# Patient Record
Sex: Male | Born: 1985 | Race: Black or African American | Hispanic: No | Smoking: Current every day smoker
Health system: Southern US, Community
[De-identification: ages and names within clinical notes are randomized; demographics above are authoritative.]

## PROBLEM LIST (undated history)

## (undated) DIAGNOSIS — S62309A Unspecified fracture of unspecified metacarpal bone, initial encounter for closed fracture: Secondary | ICD-10-CM

## (undated) HISTORY — PX: HYPOSPADIAS CORRECTION: SHX483

---

## 2009-09-18 ENCOUNTER — Emergency Department (HOSPITAL_COMMUNITY): Admission: EM | Admit: 2009-09-18 | Discharge: 2009-09-18 | Payer: Self-pay | Admitting: Emergency Medicine

## 2010-06-04 ENCOUNTER — Emergency Department (HOSPITAL_COMMUNITY): Admission: EM | Admit: 2010-06-04 | Discharge: 2010-06-04 | Payer: Self-pay | Admitting: Emergency Medicine

## 2011-03-17 LAB — URINALYSIS, ROUTINE W REFLEX MICROSCOPIC
Bilirubin Urine: NEGATIVE
Hgb urine dipstick: NEGATIVE
Ketones, ur: NEGATIVE mg/dL
Nitrite: NEGATIVE
pH: 5.5 (ref 5.0–8.0)

## 2011-10-14 ENCOUNTER — Emergency Department (HOSPITAL_COMMUNITY)
Admission: EM | Admit: 2011-10-14 | Discharge: 2011-10-14 | Disposition: A | Discharge status: Home / Self Care | Payer: Self-pay | Attending: Emergency Medicine | Admitting: Emergency Medicine

## 2011-10-14 DIAGNOSIS — R599 Enlarged lymph nodes, unspecified: Secondary | ICD-10-CM | POA: Insufficient documentation

## 2011-10-14 DIAGNOSIS — R10819 Abdominal tenderness, unspecified site: Secondary | ICD-10-CM | POA: Insufficient documentation

## 2011-10-14 DIAGNOSIS — R109 Unspecified abdominal pain: Secondary | ICD-10-CM | POA: Insufficient documentation

## 2011-10-14 LAB — URINALYSIS, ROUTINE W REFLEX MICROSCOPIC
Bilirubin Urine: NEGATIVE
Glucose, UA: NEGATIVE mg/dL
Hgb urine dipstick: NEGATIVE
Specific Gravity, Urine: 1.027 (ref 1.005–1.030)
Urobilinogen, UA: 0.2 mg/dL (ref 0.0–1.0)
pH: 6.5 (ref 5.0–8.0)

## 2012-05-10 ENCOUNTER — Emergency Department (HOSPITAL_COMMUNITY)
Admission: EM | Admit: 2012-05-10 | Discharge: 2012-05-10 | Disposition: A | Discharge status: Home / Self Care | Payer: Self-pay | Attending: Emergency Medicine | Admitting: Emergency Medicine

## 2012-05-10 ENCOUNTER — Encounter (HOSPITAL_COMMUNITY): Payer: Self-pay | Admitting: Emergency Medicine

## 2012-05-10 ENCOUNTER — Emergency Department (HOSPITAL_COMMUNITY): Payer: Self-pay

## 2012-05-10 DIAGNOSIS — M546 Pain in thoracic spine: Secondary | ICD-10-CM | POA: Insufficient documentation

## 2012-05-10 DIAGNOSIS — R059 Cough, unspecified: Secondary | ICD-10-CM | POA: Insufficient documentation

## 2012-05-10 DIAGNOSIS — J9801 Acute bronchospasm: Secondary | ICD-10-CM | POA: Insufficient documentation

## 2012-05-10 DIAGNOSIS — R05 Cough: Secondary | ICD-10-CM | POA: Insufficient documentation

## 2012-05-10 DIAGNOSIS — R062 Wheezing: Secondary | ICD-10-CM | POA: Insufficient documentation

## 2012-05-10 DIAGNOSIS — R0602 Shortness of breath: Secondary | ICD-10-CM | POA: Insufficient documentation

## 2012-05-10 DIAGNOSIS — R079 Chest pain, unspecified: Secondary | ICD-10-CM | POA: Insufficient documentation

## 2012-05-10 LAB — DIFFERENTIAL
Eosinophils Absolute: 0.2 10*3/uL (ref 0.0–0.7)
Eosinophils Relative: 1 % (ref 0–5)
Lymphocytes Relative: 9 % — ABNORMAL LOW (ref 12–46)
Lymphs Abs: 1.5 10*3/uL (ref 0.7–4.0)
Monocytes Absolute: 1.2 10*3/uL — ABNORMAL HIGH (ref 0.1–1.0)
Monocytes Relative: 7 % (ref 3–12)

## 2012-05-10 LAB — BASIC METABOLIC PANEL
BUN: 18 mg/dL (ref 6–23)
CO2: 25 mEq/L (ref 19–32)
Calcium: 8.9 mg/dL (ref 8.4–10.5)
GFR calc non Af Amer: >90 mL/min (ref >90)
Glucose, Bld: 117 mg/dL — ABNORMAL HIGH (ref 70–99)

## 2012-05-10 LAB — CBC
HCT: 41.6 % (ref 39.0–52.0)
Hemoglobin: 14.1 g/dL (ref 13.0–17.0)
MCH: 32 pg (ref 26.0–34.0)
MCV: 94.3 fL (ref 78.0–100.0)
RBC: 4.41 MIL/uL (ref 4.22–5.81)

## 2012-05-10 MED ORDER — ALBUTEROL SULFATE HFA 108 (90 BASE) MCG/ACT IN AERS
2.0000 | INHALATION_SPRAY | RESPIRATORY_TRACT | Status: DC
  Administered 2012-05-10: 2 via RESPIRATORY_TRACT
  Filled 2012-05-10: qty 6.7

## 2012-05-10 MED ORDER — PREDNISONE 10 MG PO TABS: 60.0000 mg | ORAL_TABLET | Freq: Every day | ORAL | Status: DC

## 2012-05-10 MED ORDER — ALBUTEROL SULFATE (5 MG/ML) 0.5% IN NEBU
5.0000 mg | INHALATION_SOLUTION | Freq: Once | RESPIRATORY_TRACT | Status: AC
  Administered 2012-05-10: 5 mg via RESPIRATORY_TRACT
  Filled 2012-05-10: qty 1

## 2012-05-10 MED ORDER — IPRATROPIUM BROMIDE 0.02 % IN SOLN
0.5000 mg | Freq: Once | RESPIRATORY_TRACT | Status: AC
  Administered 2012-05-10: 0.5 mg via RESPIRATORY_TRACT
  Filled 2012-05-10: qty 2.5

## 2012-05-10 MED ORDER — PREDNISONE 20 MG PO TABS
60.0000 mg | ORAL_TABLET | Freq: Once | ORAL | Status: AC
  Administered 2012-05-10: 60 mg via ORAL
  Filled 2012-05-10: qty 3

## 2012-05-10 NOTE — ED Notes (Addendum)
Patient complaining of mid-sternal chest pain, productive cough, chest congestion for the past two to three days.  Reports shortness of breath; denies other associated symptoms including nausea, vomiting, and diaphoresis.  Upon arrival to room, patient changed into gown and connected to continuous cardiac, pulse ox, and blood pressure monitor.  Will continue to monitor.

## 2012-05-10 NOTE — ED Notes (Signed)
Called respiratory about administering nebulizer treatments; respiratory tech on way.

## 2012-05-10 NOTE — ED Provider Notes (Signed)
History     CSN: [id number]  Arrival date & time 05/10/12  0143   First MD Initiated Contact with Patient 05/10/12 0157      Chief Complaint  Patient presents with  . Chest Pain     The history is provided by the patient.   patient reports several days of midsternal chest tightness with shortness of breath.  His had mild productive cough and upper back pain.  Has no prior history of asthma but there is a family history of asthma.  He continues to smoke cigarettes.  His had no fevers or chills.  He reports trying his mother's albuterol nebulizer machine at home with some improvement in the symptoms.  He has no prior history of DVT or pulmonary embolism.  There is no family history of early cardiac disease.  Patient reports ongoing cough.  The symptoms are mild to moderate.    History reviewed. No pertinent past medical history.  History reviewed. No pertinent past surgical history.  No family history on file.  History  Substance Use Topics  . Smoking status: Never Smoker   . Smokeless tobacco: Not on file  . Alcohol Use: No      Review of Systems  Cardiovascular: Positive for chest pain.  All other systems reviewed and are negative.    Allergies  Review of patient's allergies indicates no known allergies.  Home Medications   Current Outpatient Rx  Name Route Sig Dispense Refill  . ALBUTEROL SULFATE HFA 108 (90 BASE) MCG/ACT IN AERS Inhalation Inhale 2 puffs into the lungs every 6 (six) hours as needed. For shortness of breath.    . MUCUS RELIEF PO Oral Take 1 tablet by mouth 2 (two) times daily as needed. For chest congestion.    Marland Kitchen PREDNISONE 10 MG PO TABS Oral Take 6 tablets (60 mg total) by mouth daily. 18 tablet 0    BP 134/67  Pulse 101  Temp(Src) 100.7 F (38.2 C) (Oral)  Resp 20  SpO2 96%  Physical Exam  Nursing note and vitals reviewed. Constitutional: He is oriented to person, place, and time. He appears well-developed and well-nourished.  HENT:    Head: Normocephalic and atraumatic.  Eyes: EOM are normal.  Neck: Normal range of motion.  Cardiovascular: Normal rate, regular rhythm, normal heart sounds and intact distal pulses.   Pulmonary/Chest: Effort normal. No respiratory distress. He has wheezes.  Abdominal: Soft. He exhibits no distension. There is no tenderness.  Musculoskeletal: Normal range of motion.  Neurological: He is alert and oriented to person, place, and time.  Skin: Skin is warm and dry.  Psychiatric: He has a normal mood and affect. Judgment normal.    ED Course  Procedures (including critical care time)   Date: 05/10/2012  Rate: 114  Rhythm: Sinus tachycardia  QRS Axis: normal  Intervals: normal  ST/T Wave abnormalities: normal  Conduction Disutrbances: none  Narrative Interpretation:   Old EKG Reviewed: No prior EKG available      Labs Reviewed  CBC - Abnormal; Notable for the following:    WBC 17.7 (*)    All other components within normal limits  DIFFERENTIAL - Abnormal; Notable for the following:    Neutrophils Relative 83 (*)    Neutro Abs 14.7 (*)    Lymphocytes Relative 9 (*)    Monocytes Absolute 1.2 (*)    All other components within normal limits  BASIC METABOLIC PANEL - Abnormal; Notable for the following:    Glucose, Bld 117 (*)  All other components within normal limits  POCT I-STAT TROPONIN I   Dg Chest 2 View  05/10/2012  *RADIOLOGY REPORT*  Clinical Data: Cough, shortness of breath, chest pain and fever for 304 days.  CHEST - 2 VIEW  Comparison: None.  Findings: The heart size and pulmonary vascularity are normal. The lungs appear clear and expanded without focal air space disease or consolidation. No blunting of the costophrenic angles.  No pneumothorax.  IMPRESSION: No evidence of active pulmonary disease.  Original Report Authenticated By: Marlon Pel, M.D.     1. Bronchospasm       MDM  The patient feels much better after albuterol inhaler in the emergency  department.  I suspect this is reactive airway disease/bronchospasm.  The patient be discharged home on a short course of prednisone and albuterol inhaler.  His chest x-ray demonstrates no evidence of pneumonia.  His EKG demonstrated sinus tachycardia without ST changes.  I don't believe this is a pulmonary embolism.  Discharge home in good condition.  The patient understands to return the emergency apartment for new or worsening symptoms        Lyanne Co, MD 05/10/12 [phone]

## 2012-05-10 NOTE — ED Notes (Signed)
Dr. Campos at bedside   

## 2012-05-10 NOTE — ED Notes (Signed)
Patient states that he feels better after nebulizer treatment; will continue to monitor.

## 2012-05-10 NOTE — ED Notes (Signed)
Patient currently sitting up in bed; no respiratory or acute distress noted.  Patient updated on plan of care; informed patient that respiratory tech is on way to administer nebulizer treatment.  Patient has no other questions or concerns at this time; will continue to monitor.

## 2012-05-10 NOTE — ED Notes (Signed)
PT. REPORTS MID CHEST PAIN WITH SOB , PRODUCTIVE COUGH AND UPPER BACK PAIN FOR SEVERAL DAYS .

## 2012-05-10 NOTE — ED Notes (Signed)
Patient given discharge paperwork; went over discharge instructions with patient.  Patient instructed to take Prednisone as directed, to follow up with primary care physician in 3-5 days if symptoms do not improve, and to return to the ED for new, worsening, or concerning symptoms.

## 2012-05-10 NOTE — Discharge Instructions (Signed)
Bronchospasm, Adult  Bronchospasm means that there is a spasm or tightening of the airways going into the lungs. Because the airways go into a spasm and get smaller it makes breathing more difficult.  For reasons not completely known, workings (functions) of the airways designed to protect the lungs become over active. This causes the airways to become more sensitive to:  · Infection.  · Weather.  · Exercise.  · Irritants.  · Things that cause allergic reactions or allergies (allergens).  Frequent coughing or respiratory episodes should be checked for the cause. This condition may be made worse by exercise.  CAUSES   Inflammation is often the cause of this condition. Allergy, viral respiratory infections, or irritants in the air often cause this problem. Allergic reactions produce immediate and delayed responses. Late reactions may produce more serious inflammation. This may lead to increased reactivity of the airways. Sometimes this is inherited.  Some common triggers are:  · Allergies.  · Infection commonly triggers attacks. Antibiotics are not helpful for viral infections and usually do not help with attacks of bronchospasm.  · Exercise (running, etc.) can trigger an attack. Proper pre-exercise medications help most individuals participate in sports. Swimming is the least likely sport to cause problems.  · Irritants (for example, pollution, cigarette smoke, strong odors, aerosol sprays, paint fumes, etc.) may trigger attacks. You cannot smoke and do not allow smoking in your home. This is absolutely necessary. Show this instruction to mates, relatives and significant others that may not agree with you.  · Weather changes may cause lung problems but moving around trying to find an ideal climate does not seem to be overly helpful. Winds increase molds and pollens in the air. Rain refreshes the air by washing irritants out. Cold air may cause irritation.  · Emotional problems do not cause lung problems but can  trigger attacks.  SYMPTOMS   Wheezing is the most common symptom. Frequent coughing (with or without exercise and or crying) and repeated respiratory infections are all early warning signs of bronchospasm. Chest tightness and shortness of breath are other symptoms.  DIAGNOSIS   Early hidden bronchospasm may go for long periods of time without being detected. This is especially true if wheezing cannot be detected by your caregiver. Lung (pulmonary) function studies may help with diagnosis in these cases.  HOME CARE INSTRUCTIONS   · It is necessary to remain calm during an attack. Try to relax and breathe more slowly. During this time medications may be given. If any breathing problems seem to be getting worse and are unresponsive to treatment seek immediate medical care.  · If you have severe breathing difficulty or have had a life threatening attack it is probably a good idea for you to learn how to give adrenaline (epi-pen) or use an anaphylaxis kit. Your caregiver can help you with this. These are the same kits carried by people who have severe allergic reactions. This is especially important if you do not have readily accessible medical care.  · With any severe breathing problems where epinephrine (adrenaline) has been given at home call 911 immediately as the delayed reaction may be even more severe.  SEEK MEDICAL CARE IF:   · There is wheezing and shortness of breath, even if medications are given to prevent attacks.  · An oral temperature above 102° F (38.9° C) develops.  · There are muscle aches, chest pain, or thickening of sputum.  · The sputum changes from clear or white to yellow, green,   gray, or bloody.  · There are problems that may be related to the medicine you are given, such as a rash, itching, swelling, or trouble breathing.  SEEK IMMEDIATE MEDICAL CARE IF:   · The usual medicines do not stop your wheezing, or there is increased coughing.  · You have increased difficulty breathing.  MAKE SURE YOU:    · Understand these instructions.  · Will watch your condition.  · Will get help right away if you are not doing well or get worse.  Document Released: 12/18/2003 Document Revised: 12/04/2011 Document Reviewed: 08/02/2008  [employer]® Patient Information ©2012 [employer].

## 2012-05-10 NOTE — ED Notes (Signed)
EKG completed at 0152. Given to Dr. Patria Mane. No OLD ekg.

## 2013-02-12 ENCOUNTER — Encounter (HOSPITAL_COMMUNITY): Payer: Self-pay | Admitting: *Deleted

## 2013-02-12 ENCOUNTER — Emergency Department (HOSPITAL_COMMUNITY): Payer: Self-pay

## 2013-02-12 ENCOUNTER — Emergency Department (HOSPITAL_COMMUNITY)
Admission: EM | Admit: 2013-02-12 | Discharge: 2013-02-12 | Disposition: A | Discharge status: Home / Self Care | Payer: Self-pay | Attending: Emergency Medicine | Admitting: Emergency Medicine

## 2013-02-12 DIAGNOSIS — R55 Syncope and collapse: Secondary | ICD-10-CM | POA: Insufficient documentation

## 2013-02-12 DIAGNOSIS — T07XXXA Unspecified multiple injuries, initial encounter: Secondary | ICD-10-CM

## 2013-02-12 DIAGNOSIS — F17200 Nicotine dependence, unspecified, uncomplicated: Secondary | ICD-10-CM | POA: Insufficient documentation

## 2013-02-12 DIAGNOSIS — S060XAA Concussion with loss of consciousness status unknown, initial encounter: Secondary | ICD-10-CM

## 2013-02-12 DIAGNOSIS — Y9389 Activity, other specified: Secondary | ICD-10-CM | POA: Insufficient documentation

## 2013-02-12 DIAGNOSIS — Y9289 Other specified places as the place of occurrence of the external cause: Secondary | ICD-10-CM | POA: Insufficient documentation

## 2013-02-12 DIAGNOSIS — R296 Repeated falls: Secondary | ICD-10-CM | POA: Insufficient documentation

## 2013-02-12 DIAGNOSIS — S060X9A Concussion with loss of consciousness of unspecified duration, initial encounter: Secondary | ICD-10-CM | POA: Insufficient documentation

## 2013-02-12 DIAGNOSIS — IMO0002 Reserved for concepts with insufficient information to code with codable children: Secondary | ICD-10-CM | POA: Insufficient documentation

## 2013-02-12 MED ORDER — IBUPROFEN 200 MG PO TABS
600.0000 mg | ORAL_TABLET | Freq: Once | ORAL | Status: AC
  Administered 2013-02-12: 600 mg via ORAL
  Filled 2013-02-12: qty 3
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
## 2013-02-12 NOTE — ED Notes (Signed)
Patient was able to walk to Pod A7 without a problem.
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
## 2013-02-12 NOTE — ED Notes (Signed)
Pt parents driving pt home.

## 2013-02-12 NOTE — Discharge Instructions (Signed)
Concussion and Brain Injury  A blow or jolt to the head can disrupt the normal function of the brain. This type of brain injury is often called a "concussion" or a "closed head injury." Concussions are usually not life-threatening. Even so, the effects of a concussion can be serious.   CAUSES   A concussion is caused by a blunt blow to the head. The blow might be direct or indirect as described below.  · Direct blow (running into another player during a soccer game, being hit in a fight, or hitting your head on a hard surface).  · Indirect blow (when your head moves rapidly and violently back and forth like in a car crash).  SYMPTOMS   The brain is very complex. Every head injury is different. Some symptoms may appear right away. Other symptoms may not show up for days or weeks after the concussion. The signs of concussion can be hard to notice. Early on, problems may be missed by patients, family members, and caregivers. You may look fine even though you are acting or feeling differently.   These symptoms are usually temporary, but may last for days, weeks, or even longer. Symptoms include:  · Mild headaches that will not go away.  · Having more trouble than usual with:  · Remembering things.  · Paying attention or concentrating.  · Organizing daily tasks.  · Making decisions and solving problems.  · Slowness in thinking, acting, speaking, or reading.  · Getting lost or easily confused.  · Feeling tired all the time or lacking energy (fatigue).  · Feeling drowsy.  · Sleep disturbances.  · Sleeping more than usual.  · Sleeping less than usual.  · Trouble falling asleep.  · Trouble sleeping (insomnia).  · Loss of balance or feeling lightheaded or dizzy.  · Nausea or vomiting.  · Numbness or tingling.  · Increased sensitivity to:  · Sounds.  · Lights.  · Distractions.  Other symptoms might include:  · Vision problems or eyes that tire easily.  · Diminished sense of taste or smell.  · Ringing in the ears.  · Mood  changes such as feeling sad, anxious, or listless.  · Becoming easily irritated or angry for little or no reason.  · Lack of motivation.  DIAGNOSIS   Your caregiver can usually diagnose a concussion or mild brain injury based on your description of your injury and your symptoms.   Your evaluation might include:  · A brain scan to look for signs of injury to the brain. Even if the test shows no injury, you may still have a concussion.  · Blood tests to be sure other problems are not present.  TREATMENT   · People with a concussion need to be examined and evaluated. Most people with concussions are treated in an emergency department, urgent care, or clinic. Some people must stay in the hospital overnight for further treatment.  · Your caregiver will send you home with important instructions to follow. Be sure to carefully follow them.  · Tell your caregiver if you are already taking any medicines (prescription, over-the-counter, or natural remedies), or if you are drinking alcohol or taking illegal drugs. Also, talk with your caregiver if you are taking blood thinners (anticoagulants) or aspirin. These drugs may increase your chances of complications. All of this is important information that may affect treatment.  · Only take over-the-counter or prescription medicines for pain, discomfort, or fever as directed by your caregiver.  PROGNOSIS     How fast people recover from brain injury varies from person to person. Although most people have a good recovery, how quickly they improve depends on many factors. These factors include how severe their concussion was, what part of the brain was injured, their age, and how healthy they were before the concussion.   Because all head injuries are different, so is recovery. Most people with mild injuries recover fully. Recovery can take time. In general, recovery is slower in older persons. Also, persons who have had a concussion in the past or have other medical problems may find  that it takes longer to recover from their current injury. Anxiety and depression may also make it harder to adjust to the symptoms of brain injury.  HOME CARE INSTRUCTIONS   Return to your normal activities slowly, not all at once. You must give your body and brain enough time for recovery.  · Get plenty of sleep at night, and rest during the day. Rest helps the brain to heal.  · Avoid staying up late at night.  · Keep the same bedtime hours on weekends and weekdays.  · Take daytime naps or rest breaks when you feel tired.  · Limit activities that require a lot of thought or concentration (brain or cognitive rest). This includes:  · Homework or job-related work.  · Watching TV.  · Computer work.  · Avoid activities that could lead to a second brain injury, such as contact or recreational sports, until your caregiver says it is okay. Even after your brain injury has healed, you should protect yourself from having another concussion.  · Ask your caregiver when you can return to your normal activities such as driving, bicycling, or operating heavy equipment. Your ability to react may be slower after a brain injury.  · Talk with your caregiver about when you can return to work or school.  · Inform your teachers, school nurse, school counselor, coach, athletic trainer, or work manager about your injury, symptoms, and restrictions. They should be instructed to report:  · Increased problems with attention or concentration.  · Increased problems remembering or learning new information.  · Increased time needed to complete tasks or assignments.  · Increased irritability or decreased ability to cope with stress.  · Increased symptoms.  · Take only those medicines that your caregiver has approved.  · Do not drink alcohol until your caregiver says you are well enough to do so. Alcohol and certain other drugs may slow your recovery and can put you at risk of further injury.  · If it is harder than usual to remember things,  write them down.  · If you are easily distracted, try to do one thing at a time. For example, do not try to watch TV while fixing dinner.  · Talk with family members or close friends when making important decisions.  · Keep all follow-up appointments. Repeated evaluation of your symptoms is recommended for your recovery.  PREVENTION   Protect your head from future injury. It is very important to avoid another head or brain injury before you have recovered. In rare cases, another injury has lead to permanent brain damage, brain swelling, or death. Avoid injuries by using:  · Seatbelts when riding in a car.  · Alcohol only in moderation.  · A helmet when biking, skiing, skateboarding, skating, or doing similar activities.  · Safety measures in your home.  · Remove clutter and tripping hazards from floors and stairways.  · Use grab   bars in bathrooms and handrails by stairs.  · Place non-slip mats on floors and in bathtubs.  · Improve lighting in dim areas.  SEEK MEDICAL CARE IF:   A head injury can cause lingering symptoms. You should seek medical care if you have any of the following symptoms for more than 3 weeks after your injury or are planning to return to sports:  · Chronic headaches.  · Dizziness or balance problems.  · Nausea.  · Vision problems.  · Increased sensitivity to noise or light.  · Depression or mood swings.  · Anxiety or irritability.  · Memory problems.  · Difficulty concentrating or paying attention.  · Sleep problems.  · Feeling tired all the time.  SEEK IMMEDIATE MEDICAL CARE IF:   You have had a blow or jolt to the head and you (or your family or friends) notice:  · Severe or worsening headaches.  · Weakness (even if only in one hand or one leg or one part of the face), numbness, or decreased coordination.  · Repeated vomiting.  · Increased sleepiness or passing out.  · One black center of the eye (pupil) is larger than the other.  · Convulsions (seizures).  · Slurred speech.  · Increasing  confusion, restlessness, agitation, or irritability.  · Lack of ability to recognize people or places.  · Neck pain.  · Difficulty being awakened.  · Unusual behavior changes.  · Loss of consciousness.  Older adults with a brain injury may have a higher risk of serious complications such as a blood clot on the brain. Headaches that get worse or an increase in confusion are signs of this complication. If these signs occur, see a caregiver right away.  MAKE SURE YOU:   · Understand these instructions.  · Will watch your condition.  · Will get help right away if you are not doing well or get worse.  FOR MORE INFORMATION   Several groups help people with brain injury and their families. They provide information and put people in touch with local resources. These include support groups, rehabilitation services, and a variety of health care professionals. Among these groups, the Brain Injury Association (BIA, www.biausa.org) has a national office that gathers scientific and educational information and works on a national level to help people with brain injury.   Document Released: 03/06/2004 Document Revised: 03/08/2012 Document Reviewed: 08/02/2008  ExitCare® Patient Information ©2013 [employer].

## 2014-03-03 DIAGNOSIS — S62309A Unspecified fracture of unspecified metacarpal bone, initial encounter for closed fracture: Secondary | ICD-10-CM

## 2014-03-03 HISTORY — DX: Unspecified fracture of unspecified metacarpal bone, initial encounter for closed fracture: S62.309A

## 2014-03-06 ENCOUNTER — Emergency Department (HOSPITAL_COMMUNITY)
Admission: EM | Admit: 2014-03-06 | Discharge: 2014-03-06 | Disposition: A | Discharge status: Home / Self Care | Payer: Self-pay | Attending: Emergency Medicine | Admitting: Emergency Medicine

## 2014-03-06 ENCOUNTER — Encounter (HOSPITAL_COMMUNITY): Payer: Self-pay | Admitting: Emergency Medicine

## 2014-03-06 ENCOUNTER — Emergency Department (HOSPITAL_COMMUNITY): Payer: Self-pay

## 2014-03-06 DIAGNOSIS — M79641 Pain in right hand: Secondary | ICD-10-CM

## 2014-03-06 DIAGNOSIS — Y93E5 Activity, floor mopping and cleaning: Secondary | ICD-10-CM | POA: Insufficient documentation

## 2014-03-06 DIAGNOSIS — W208XXA Other cause of strike by thrown, projected or falling object, initial encounter: Secondary | ICD-10-CM | POA: Insufficient documentation

## 2014-03-06 DIAGNOSIS — Y92009 Unspecified place in unspecified non-institutional (private) residence as the place of occurrence of the external cause: Secondary | ICD-10-CM | POA: Insufficient documentation

## 2014-03-06 DIAGNOSIS — S62309A Unspecified fracture of unspecified metacarpal bone, initial encounter for closed fracture: Secondary | ICD-10-CM

## 2014-03-06 DIAGNOSIS — S62329A Displaced fracture of shaft of unspecified metacarpal bone, initial encounter for closed fracture: Secondary | ICD-10-CM | POA: Insufficient documentation

## 2014-03-06 DIAGNOSIS — F17200 Nicotine dependence, unspecified, uncomplicated: Secondary | ICD-10-CM | POA: Insufficient documentation

## 2014-03-06 IMAGING — CR DG HAND COMPLETE 3+V*R*
3 series · 3 of 3 positions shown · non-contrast
Comparison: None.

CLINICAL DATA: Recent traumatic injury with pain

EXAM:
RIGHT HAND - COMPLETE 3+ VIEW

[x hand pa right *]
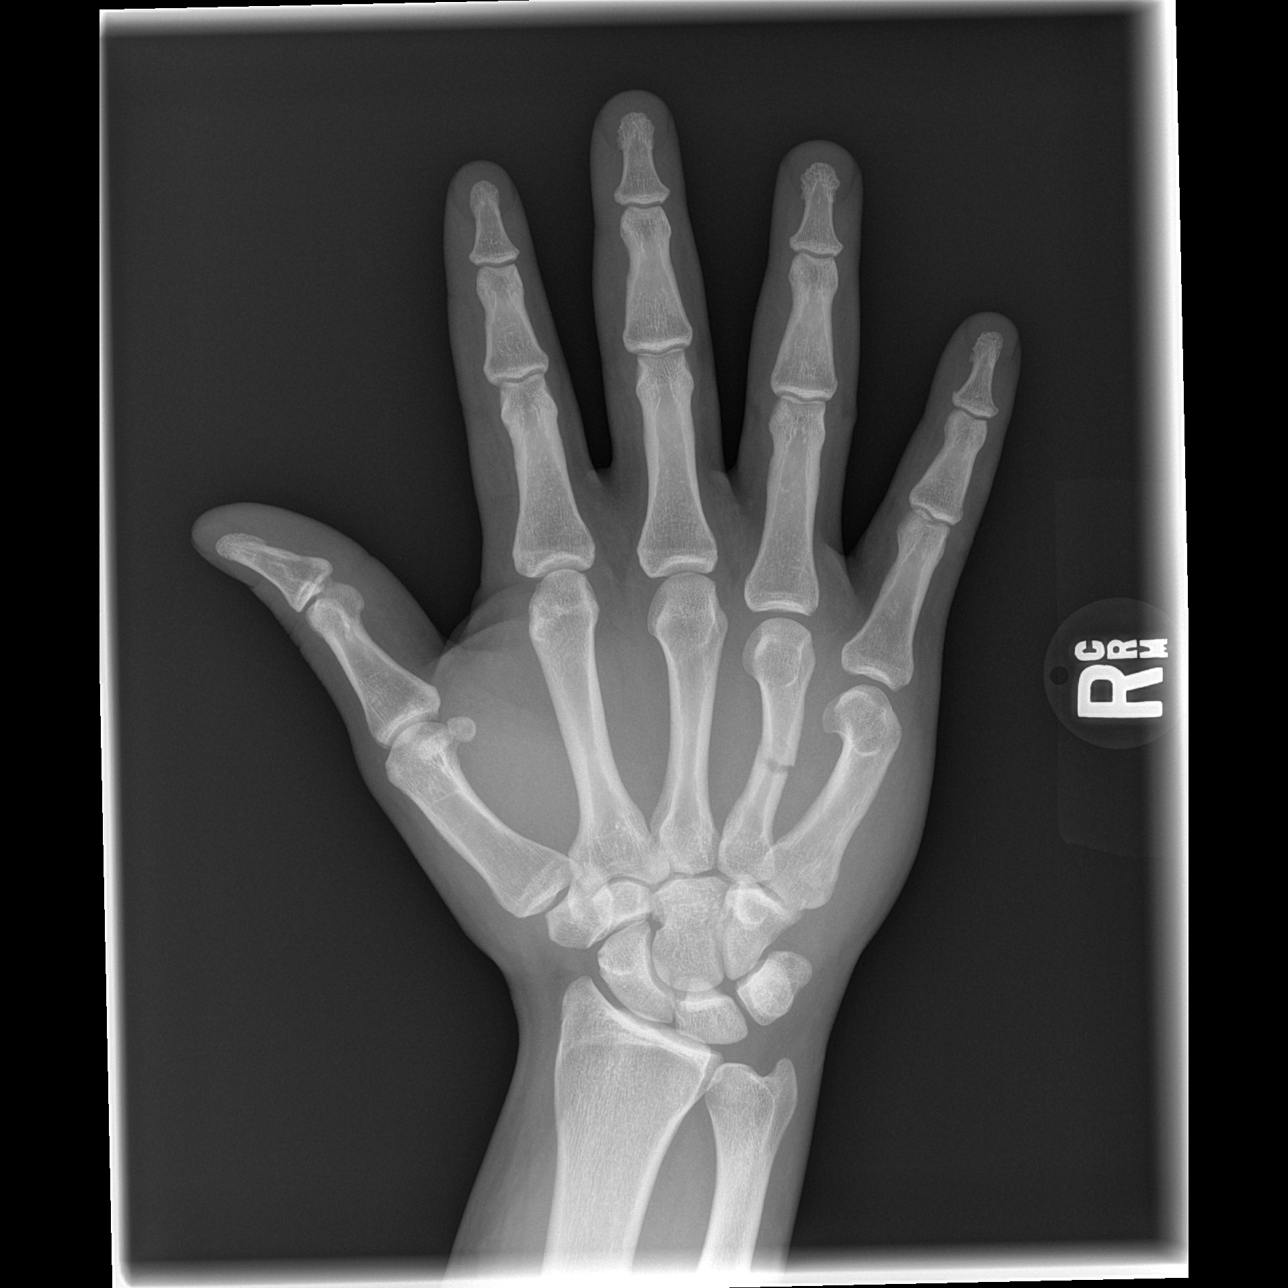

[x hand oblique right *]
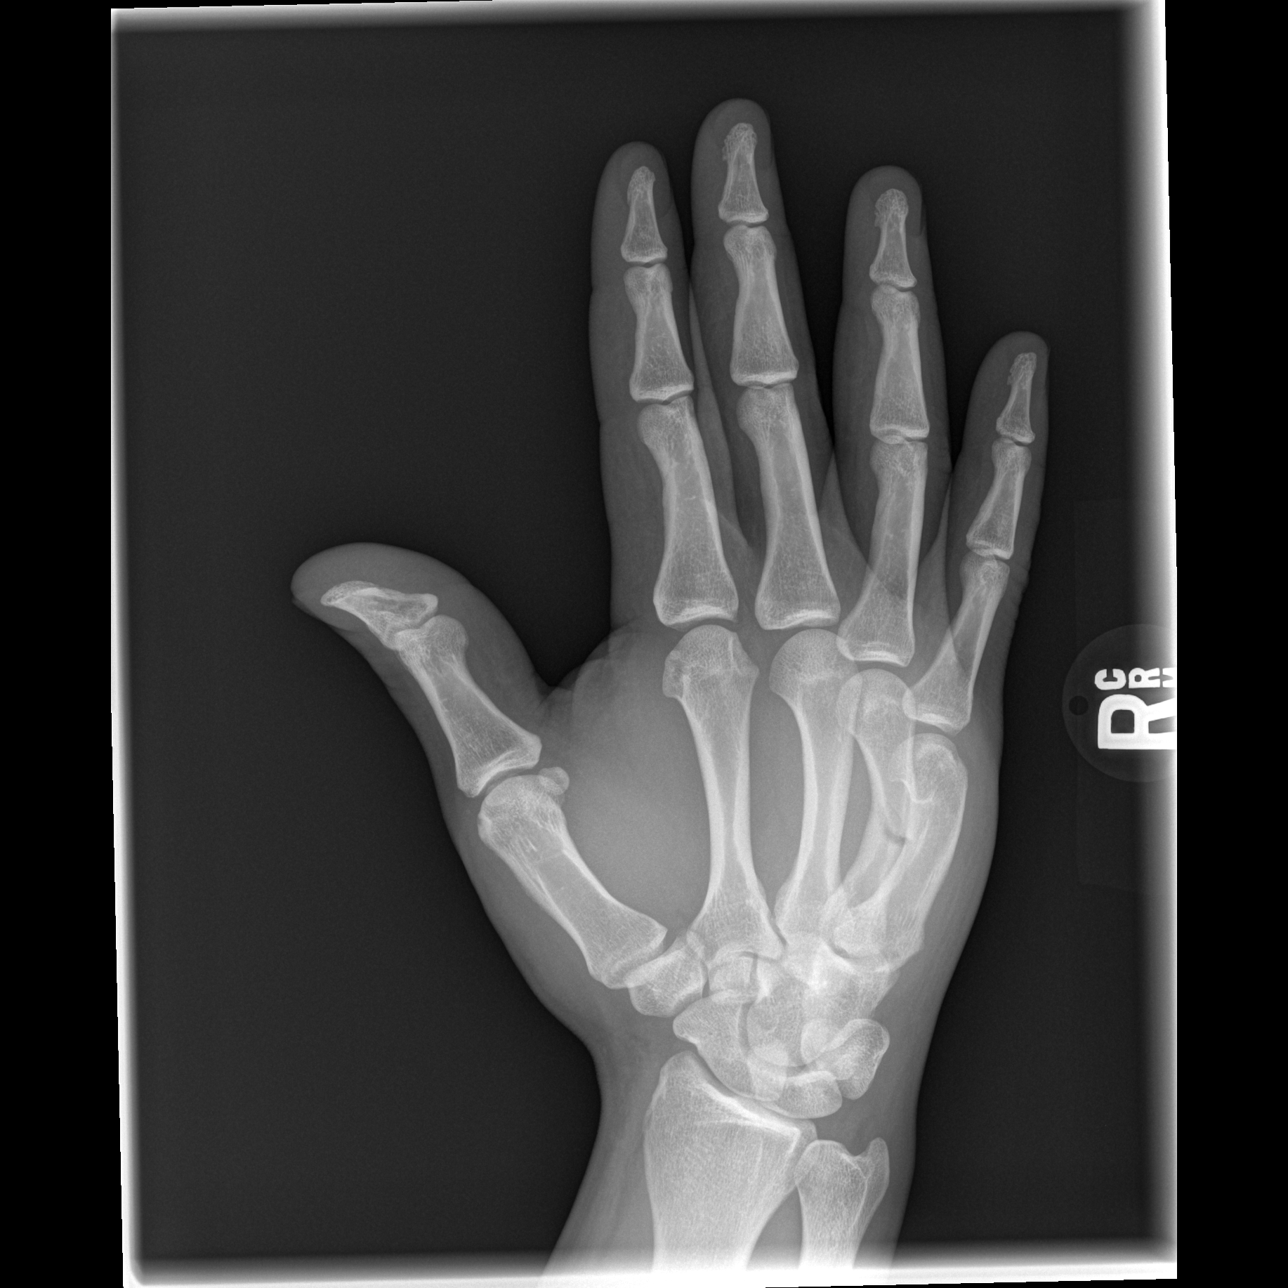

[x hand lat right]
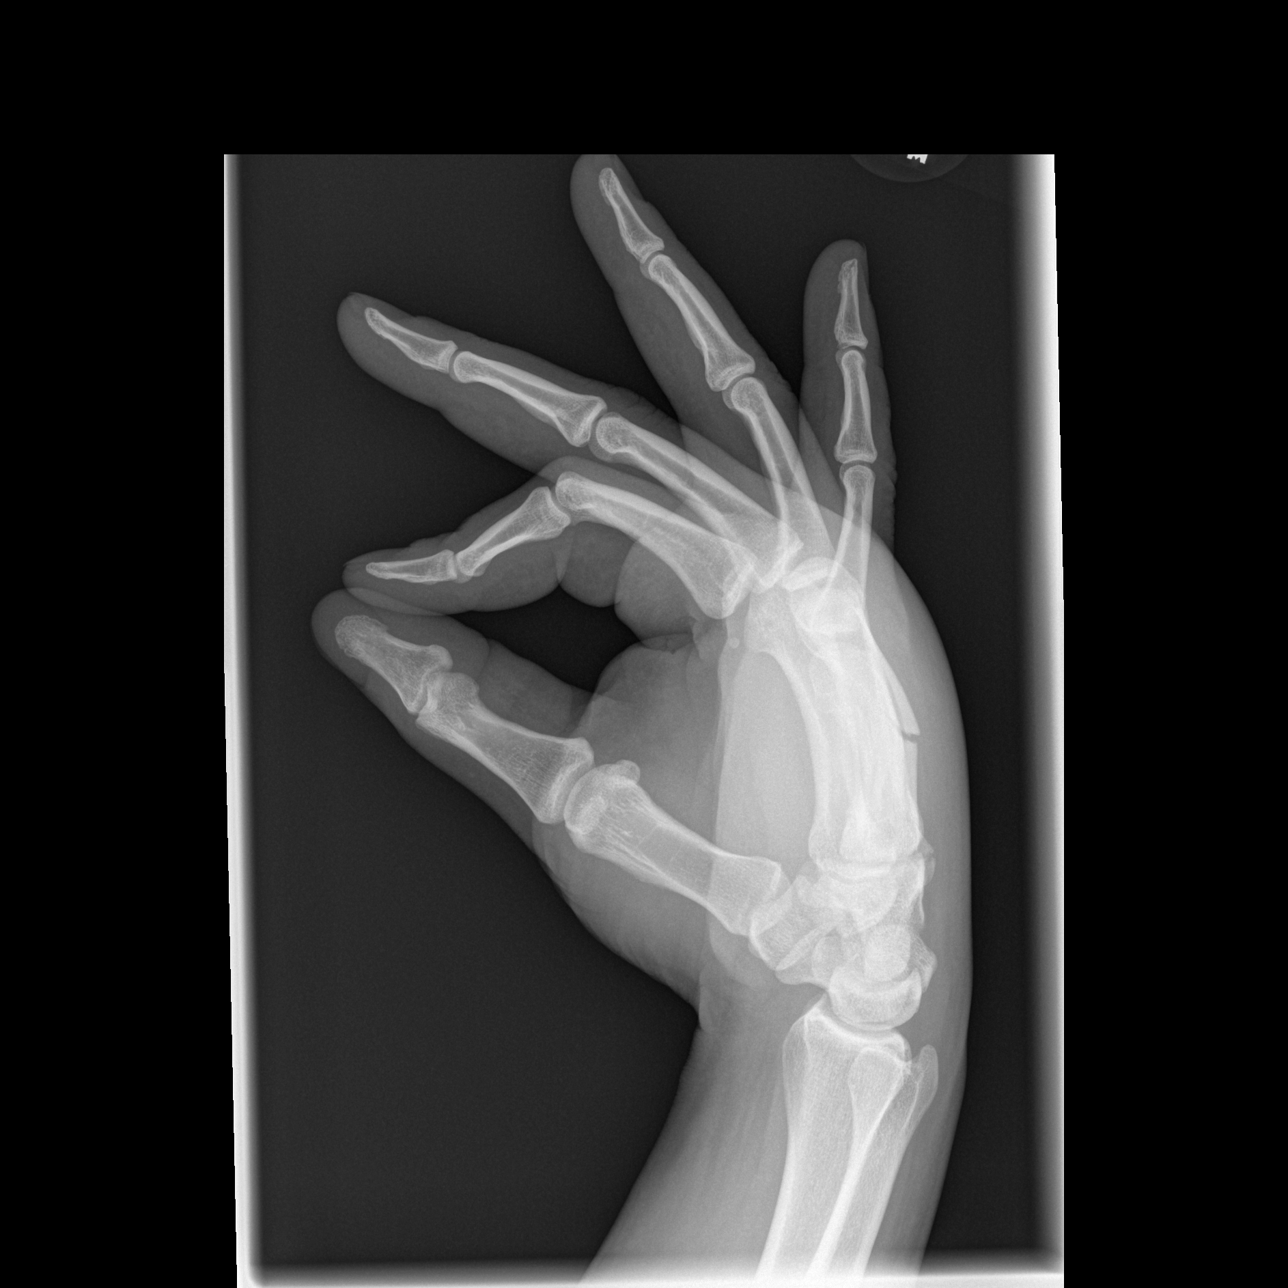

[3 of 3 positions shown; findings below may reference images not displayed]

FINDINGS: There are changes consistent with a prior healed fracture of the
fifth metacarpal distally. A midshaft fourth metacarpal fracture is
noted with mild angulation at the fracture site. No other bony
abnormality is seen.
IMPRESSION: Acute fourth metacarpal fracture

## 2014-03-06 MED ORDER — IBUPROFEN 600 MG PO TABS: 600.0000 mg | ORAL_TABLET | Freq: Four times a day (QID) | ORAL | Status: DC | PRN

## 2014-03-06 NOTE — ED Notes (Signed)
Ortho at bedside.

## 2014-03-06 NOTE — ED Notes (Signed)
Ortho tech states she will be the pt after she finishes up with the pt she is with.

## 2014-03-06 NOTE — Discharge Instructions (Signed)
Followup with Dr. Mina Marble in the office tomorrow. Call office in the morning to schedule appointment. Recommend that you take ibuprofen as needed for pain control. Also ice your hand to limit swelling. Return to the emergency department if symptoms worsen.  Hand Fracture, Metacarpals Fractures of metacarpals are breaks in the bones of the hand. They extend from the knuckles to the wrist. These bones can undergo many types of fractures. There are different ways of treating these fractures, all of which may be correct. TREATMENT  Hand fractures can be treated with:   Non-reduction - The fracture is casted without changing the positions of the fracture (bone pieces) involved. This fracture is usually left in a cast for 4 to 6 weeks or as your caregiver thinks necessary.  Closed reduction - The bones are moved back into position without surgery and then casted.  ORIF (open reduction and internal fixation) - The fracture site is opened and the bone pieces are fixed into place with some type of hardware, such as screws, etc. They are then casted. Your caregiver will discuss the type of fracture you have and the treatment that should be best for that problem. If surgery is chosen, let your caregivers know about the following.  LET YOUR CAREGIVERS KNOW ABOUT:  Allergies.  Medications you are taking, including herbs, eye drops, over the counter medications, and creams.  Use of steroids (by mouth or creams).  Previous problems with anesthetics or novocaine.  Possibility of pregnancy.  History of blood clots (thrombophlebitis).  History of bleeding or blood problems.  Previous surgeries.  Other health problems. AFTER THE PROCEDURE After surgery, you will be taken to the recovery area where a nurse will watch and check your progress. Once you are awake, stable, and taking fluids well, barring other problems, you'll be allowed to go home. Once home, an ice pack applied to your operative site may  help with pain and keep the swelling down. HOME CARE INSTRUCTIONS   Follow your caregiver's instructions as to activities, exercises, physical therapy, and driving a car.  Daily exercise is helpful for keeping range of motion and strength. Exercise as instructed.  To lessen swelling, keep the injured hand elevated above the level of your heart as much as possible.  Apply ice to the injury for 15-20 minutes each hour while awake for the first 2 days. Put the ice in a plastic bag and place a thin towel between the bag of ice and your cast.  Move the fingers of your casted hand several times a day.  If a plaster or fiberglass cast was applied:  Do not try to scratch the skin under the cast using a sharp or pointed object.  Check the skin around the cast every day. You may put lotion on red or sore areas.  Keep your cast dry. Your cast can be protected during bathing with a plastic bag. Do not put your cast into the water.  If a plaster splint was applied:  Wear your splint for as long as directed by your caregiver or until seen again.  Do not get your splint wet. Protect it during bathing with a plastic bag.  You may loosen the elastic bandage around the splint if your fingers start to get numb, tingle, get cold or turn blue.  Do not put pressure on your cast or splint; this may cause it to break. Especially, do not lean plaster casts on hard surfaces for 24 hours after application.  Take medications as  directed by your caregiver.  Only take over-the-counter or prescription medicines for pain, discomfort, or fever as directed by your caregiver.  Follow-up as provided by your caregiver. This is very important in order to avoid permanent injury or disability and chronic pain. SEEK MEDICAL CARE IF:   Increased bleeding (more than a small spot) from beneath your cast or splint if there is beneath the cast as with an open reduction.  Redness, swelling, or increasing pain in the wound  or from beneath your cast or splint.  Pus coming from wound or from beneath your cast or splint.  An unexplained oral temperature above 102 F (38.9 C) develops, or as your caregiver suggests.  A foul smell coming from the wound or dressing or from beneath your cast or splint.  You have a problem moving any of your fingers. SEEK IMMEDIATE MEDICAL CARE IF:   You develop a rash  You have difficulty breathing  You have any allergy problems If you do not have a window in your cast for observing the wound, a discharge or minor bleeding may show up as a stain on the outside of your cast. Report these findings to your caregiver. MAKE SURE YOU:   Understand these instructions.  Will watch your condition.  Will get help right away if you are not doing well or get worse. Document Released: 12/15/2005 Document Revised: 03/08/2012 Document Reviewed: 08/03/2008 [facility] Patient Information 2014 [place], [place].  Cast or Splint Care Casts and splints support injured limbs and keep bones from moving while they heal. It is important to care for your cast or splint at home.  HOME CARE INSTRUCTIONS  Keep the cast or splint uncovered during the drying period. It can take 24 to 48 hours to dry if it is made of plaster. A fiberglass cast will dry in less than 1 hour.  Do not rest the cast on anything harder than a pillow for the first 24 hours.  Do not put weight on your injured limb or apply pressure to the cast until your health care provider gives you permission.  Keep the cast or splint dry. Wet casts or splints can lose their shape and may not support the limb as well. A wet cast that has lost its shape can also create harmful pressure on your skin when it dries. Also, wet skin can become infected.  Cover the cast or splint with a plastic bag when bathing or when out in the rain or snow. If the cast is on the trunk of the body, take sponge baths until the cast is removed.  If your cast  does become wet, dry it with a towel or a blow dryer on the cool setting only.  Keep your cast or splint clean. Soiled casts may be wiped with a moistened cloth.  Do not place any hard or soft foreign objects under your cast or splint, such as cotton, toilet paper, lotion, or powder.  Do not try to scratch the skin under the cast with any object. The object could get stuck inside the cast. Also, scratching could lead to an infection. If itching is a problem, use a blow dryer on a cool setting to relieve discomfort.  Do not trim or cut your cast or remove padding from inside of it.  Exercise all joints next to the injury that are not immobilized by the cast or splint. For example, if you have a long leg cast, exercise the hip joint and toes. If you have  an arm cast or splint, exercise the shoulder, elbow, thumb, and fingers.  Elevate your injured arm or leg on 1 or 2 pillows for the first 1 to 3 days to decrease swelling and pain.It is best if you can comfortably elevate your cast so it is higher than your heart. SEEK MEDICAL CARE IF:   Your cast or splint cracks.  Your cast or splint is too tight or too loose.  You have unbearable itching inside the cast.  Your cast becomes wet or develops a soft spot or area.  You have a bad smell coming from inside your cast.  You get an object stuck under your cast.  Your skin around the cast becomes red or raw.  You have new pain or worsening pain after the cast has been applied. SEEK IMMEDIATE MEDICAL CARE IF:   You have fluid leaking through the cast.  You are unable to move your fingers or toes.  You have discolored (blue or white), cool, painful, or very swollen fingers or toes beyond the cast.  You have tingling or numbness around the injured area.  You have severe pain or pressure under the cast.  You have any difficulty with your breathing or have shortness of breath.  You have chest pain. Document Released: 12/12/2000  Document Revised: 10/05/2013 Document Reviewed: 06/23/2013 [facility] Patient Information 2014 [place], [place].

## 2014-03-06 NOTE — ED Provider Notes (Signed)
CSN: [id number]     Arrival date & time 03/06/14  2022 History   None    This chart was scribed for non-physician practitioner, Antony Madura, PA-C working with Gavin Pound. Oletta Lamas, MD by Arlan Organ, ED Scribe. This patient was seen in room TR06C/TR06C and the patient's care was started at 10:02 PM.   Chief Complaint  Patient presents with  . Hand Injury   The history is provided by the patient. No language interpreter was used.    HPI Comments: Troy Mcgee is a 28 y.o. dominant right handed male who presents to the Emergency Department complaining of gradually worsening, constant right hand pain x 3 days. He states he was recently cleaning out his garage and dropped a box of textbooks on his hand. Pt also reports associated swelling to the area. Currently he describes the pain as aching and throbbing.  He has tried soaking his hand in Epson salt with mild noticeable improvement. Denies trying any OTC medications for his pain. At this time he denies any weakness, loss of sensation, numbness, or fever. Pt has no pertinent medical history, no other concerns this visit.   History reviewed. No pertinent past medical history. History reviewed. No pertinent past surgical history. History reviewed. No pertinent family history. History  Substance Use Topics  . Smoking status: Current Every Day Smoker    Types: Cigarettes  . Smokeless tobacco: Not on file  . Alcohol Use: Yes     Comment: occ    Review of Systems  Constitutional: Negative for fever and chills.  Musculoskeletal: Positive for arthralgias and joint swelling.  Skin: Negative for rash.  Neurological: Negative for weakness and numbness.     Allergies  Eggs or egg-derived products  Home Medications   Current Outpatient Rx  Name  Route  Sig  Dispense  Refill  . ibuprofen (ADVIL,MOTRIN) 600 MG tablet   Oral   Take 1 tablet (600 mg total) by mouth every 6 (six) hours as needed.   30 tablet   0     Triage Vitals: BP 169/72   Pulse 78  Temp(Src) 98.4 F (36.9 C) (Oral)  Resp 16  Ht 5\' 9"  (1.753 m)  Wt 201 lb 12.8 oz (91.536 kg)  BMI 29.79 kg/m2  SpO2 98%   Physical Exam  Nursing note and vitals reviewed. Constitutional: He is oriented to person, place, and time. He appears well-developed and well-nourished. No distress.  R hand dominant  HENT:  Head: Normocephalic and atraumatic.  Eyes: Conjunctivae and EOM are normal. No scleral icterus.  Neck: Normal range of motion.  Cardiovascular: Normal rate, regular rhythm and intact distal pulses.   Distal radial pulses 2+ bilaterally. Capillary refill normal in all fingers of right hand.  Pulmonary/Chest: Effort normal. No respiratory distress.  Musculoskeletal: He exhibits tenderness.       Right hand: He exhibits decreased range of motion (Only slightly secondary to pain), tenderness, bony tenderness and swelling. He exhibits normal two-point discrimination, normal capillary refill and no deformity. Normal sensation noted. Normal strength noted.       Hands: Neurological: He is alert and oriented to person, place, and time.  No gross sensory deficits appreciated. Finger to thumb opposition intact. Patient has only mildly decreased grip strength of his right hand compared to the left. Able to make "OK" and "thumbs up" sign.  Skin: Skin is warm and dry. No rash noted. He is not diaphoretic. No erythema. No pallor.  Psychiatric: He has a normal mood  and affect. His behavior is normal.    ED Course  Procedures (including critical care time)  DIAGNOSTIC STUDIES: Oxygen Saturation is 98% on RA, Normal by my interpretation.    COORDINATION OF CARE: 10:04 PM- Will order X-Ray. Will consult with hand surgeon. Discussed treatment plan with pt at bedside and pt agreed to plan.     Labs Review Labs Reviewed - No data to display Imaging Review Dg Hand Complete Right  03/06/2014   CLINICAL DATA:  Recent traumatic injury with pain  EXAM: RIGHT HAND - COMPLETE 3+ VIEW   COMPARISON:  None.  FINDINGS: There are changes consistent with a prior healed fracture of the fifth metacarpal distally. A midshaft fourth metacarpal fracture is noted with mild angulation at the fracture site. No other bony abnormality is seen.  IMPRESSION: Acute fourth metacarpal fracture   Electronically Signed   By: Alcide CleverMark  Lukens M.D.   On: 03/06/2014 21:25     EKG Interpretation None      MDM   Final diagnoses:  Metacarpal bone fracture  Right hand pain    Uncomplicated fourth metacarpal fracture secondary to books falling on hand 3 days ago. Patient neurovascularly intact on physical exam. He has normal sensation and opposition. X-ray shows mild angulation fracture site. Have consulted with Dr. Mina MarbleWeingold regarding outpatient followup. Dr. Mina MarbleWeingold to see patient in office tomorrow. Patient placed in volar splint for stability. He is stable and appropriate for discharge with prescription for ibuprofen and instructions to ice his hand. Return precautions provided and patient agreeable to plan with no unaddressed concerns.  I personally performed the services described in this documentation, which was scribed in my presence. The recorded information has been reviewed and is accurate.    Antony MaduraKelly Orenthal Debski, PA-C 03/06/14 2225

## 2014-03-06 NOTE — ED Notes (Signed)
PA at bedside.

## 2014-03-06 NOTE — ED Notes (Signed)
Ortho at BS

## 2014-03-06 NOTE — ED Notes (Signed)
Ortho tech paged  

## 2014-03-06 NOTE — ED Notes (Signed)
Patient states he was cleaning his garage on Saturday, dropped a box of textbooks on hand.  Right hand with swelling.

## 2014-03-07 ENCOUNTER — Encounter (HOSPITAL_BASED_OUTPATIENT_CLINIC_OR_DEPARTMENT_OTHER): Payer: Self-pay | Admitting: *Deleted

## 2014-03-08 ENCOUNTER — Other Ambulatory Visit: Payer: Self-pay | Admitting: Orthopedic Surgery

## 2014-03-08 NOTE — ED Provider Notes (Signed)
Medical screening examination/treatment/procedure(s) were performed by non-physician practitioner and as supervising physician I was immediately available for consultation/collaboration.   EKG Interpretation None        Kerah Hardebeck Y. Karstyn Birkey, MD 03/08/14 2036 

## 2014-03-10 ENCOUNTER — Ambulatory Visit (HOSPITAL_BASED_OUTPATIENT_CLINIC_OR_DEPARTMENT_OTHER)
Admission: RE | Admit: 2014-03-10 | Discharge: 2014-03-10 | Disposition: A | Discharge status: Home / Self Care | Payer: Self-pay | Source: Ambulatory Visit | Attending: Orthopedic Surgery | Admitting: Orthopedic Surgery

## 2014-03-10 ENCOUNTER — Encounter (HOSPITAL_BASED_OUTPATIENT_CLINIC_OR_DEPARTMENT_OTHER): Payer: Self-pay | Admitting: *Deleted

## 2014-03-10 ENCOUNTER — Ambulatory Visit (HOSPITAL_BASED_OUTPATIENT_CLINIC_OR_DEPARTMENT_OTHER): Payer: Self-pay | Admitting: Anesthesiology

## 2014-03-10 ENCOUNTER — Encounter (HOSPITAL_BASED_OUTPATIENT_CLINIC_OR_DEPARTMENT_OTHER): Payer: Self-pay | Admitting: Anesthesiology

## 2014-03-10 ENCOUNTER — Encounter (HOSPITAL_BASED_OUTPATIENT_CLINIC_OR_DEPARTMENT_OTHER)
Admission: RE | Disposition: A | Discharge status: Home / Self Care | Payer: Self-pay | Source: Ambulatory Visit | Attending: Orthopedic Surgery

## 2014-03-10 DIAGNOSIS — S62309A Unspecified fracture of unspecified metacarpal bone, initial encounter for closed fracture: Secondary | ICD-10-CM | POA: Insufficient documentation

## 2014-03-10 DIAGNOSIS — F17200 Nicotine dependence, unspecified, uncomplicated: Secondary | ICD-10-CM | POA: Insufficient documentation

## 2014-03-10 DIAGNOSIS — W208XXA Other cause of strike by thrown, projected or falling object, initial encounter: Secondary | ICD-10-CM | POA: Insufficient documentation

## 2014-03-10 DIAGNOSIS — Y92009 Unspecified place in unspecified non-institutional (private) residence as the place of occurrence of the external cause: Secondary | ICD-10-CM | POA: Insufficient documentation

## 2014-03-10 DIAGNOSIS — S62329A Displaced fracture of shaft of unspecified metacarpal bone, initial encounter for closed fracture: Secondary | ICD-10-CM

## 2014-03-10 HISTORY — DX: Unspecified fracture of unspecified metacarpal bone, initial encounter for closed fracture: S62.309A

## 2014-03-10 HISTORY — PX: OPEN REDUCTION INTERNAL FIXATION (ORIF) METACARPAL: SHX6234

## 2014-03-10 LAB — POCT HEMOGLOBIN-HEMACUE: Hemoglobin: 15.4 g/dL (ref 13.0–17.0)

## 2014-03-10 SURGERY — OPEN REDUCTION INTERNAL FIXATION (ORIF) METACARPAL
Anesthesia: General | Site: Hand | Laterality: Right

## 2014-03-10 MED ORDER — LACTATED RINGERS IV SOLN
INTRAVENOUS | Status: DC
Start: 2014-03-10 — End: 2014-03-10
  Administered 2014-03-10 (×2): via INTRAVENOUS

## 2014-03-10 MED ORDER — OXYCODONE HCL 5 MG/5ML PO SOLN
5.0000 mg | Freq: Once | ORAL | Status: AC | PRN
Start: 2014-03-10 — End: 2014-03-10

## 2014-03-10 MED ORDER — MIDAZOLAM HCL 2 MG/2ML IJ SOLN
INTRAMUSCULAR | Status: AC
  Filled 2014-03-10: qty 2

## 2014-03-10 MED ORDER — OXYCODONE-ACETAMINOPHEN 5-325 MG PO TABS: 1.0000 | ORAL_TABLET | ORAL | Status: DC | PRN

## 2014-03-10 MED ORDER — LIDOCAINE HCL (CARDIAC) 20 MG/ML IV SOLN
INTRAVENOUS | Status: DC | PRN
  Administered 2014-03-10: 80 mg via INTRAVENOUS

## 2014-03-10 MED ORDER — CHLORHEXIDINE GLUCONATE 4 % EX LIQD: 60.0000 mL | Freq: Once | CUTANEOUS | Status: DC

## 2014-03-10 MED ORDER — CEFAZOLIN SODIUM-DEXTROSE 2-3 GM-% IV SOLR
2.0000 g | INTRAVENOUS | Status: AC
  Administered 2014-03-10: 2 g via INTRAVENOUS

## 2014-03-10 MED ORDER — OXYCODONE HCL 5 MG PO TABS
ORAL_TABLET | ORAL | Status: AC
  Filled 2014-03-10: qty 1

## 2014-03-10 MED ORDER — BUPIVACAINE HCL (PF) 0.25 % IJ SOLN
INTRAMUSCULAR | Status: DC | PRN
  Administered 2014-03-10: 9 mL

## 2014-03-10 MED ORDER — FENTANYL CITRATE 0.05 MG/ML IJ SOLN
INTRAMUSCULAR | Status: AC
  Filled 2014-03-10: qty 2

## 2014-03-10 MED ORDER — FENTANYL CITRATE 0.05 MG/ML IJ SOLN: 50.0000 ug | INTRAMUSCULAR | Status: DC | PRN

## 2014-03-10 MED ORDER — CEFAZOLIN SODIUM-DEXTROSE 2-3 GM-% IV SOLR
INTRAVENOUS | Status: AC
  Filled 2014-03-10: qty 50

## 2014-03-10 MED ORDER — PROPOFOL 10 MG/ML IV BOLUS
INTRAVENOUS | Status: DC | PRN
  Administered 2014-03-10: 200 mg via INTRAVENOUS

## 2014-03-10 MED ORDER — PROMETHAZINE HCL 25 MG/ML IJ SOLN: 6.2500 mg | INTRAMUSCULAR | Status: DC | PRN

## 2014-03-10 MED ORDER — HYDROMORPHONE HCL PF 1 MG/ML IJ SOLN
INTRAMUSCULAR | Status: AC
  Filled 2014-03-10: qty 1

## 2014-03-10 MED ORDER — MIDAZOLAM HCL 5 MG/5ML IJ SOLN
INTRAMUSCULAR | Status: DC | PRN
  Administered 2014-03-10: 2 mg via INTRAVENOUS

## 2014-03-10 MED ORDER — ONDANSETRON HCL 4 MG/2ML IJ SOLN
INTRAMUSCULAR | Status: DC | PRN
  Administered 2014-03-10: 4 mg via INTRAVENOUS

## 2014-03-10 MED ORDER — OXYCODONE HCL 5 MG PO TABS
5.0000 mg | ORAL_TABLET | Freq: Once | ORAL | Status: AC | PRN
  Administered 2014-03-10: 5 mg via ORAL

## 2014-03-10 MED ORDER — FENTANYL CITRATE 0.05 MG/ML IJ SOLN
INTRAMUSCULAR | Status: DC | PRN
  Administered 2014-03-10: 100 ug via INTRAVENOUS

## 2014-03-10 MED ORDER — MIDAZOLAM HCL 2 MG/2ML IJ SOLN: 1.0000 mg | INTRAMUSCULAR | Status: DC | PRN

## 2014-03-10 MED ORDER — HYDROMORPHONE HCL PF 1 MG/ML IJ SOLN
0.2500 mg | INTRAMUSCULAR | Status: DC | PRN
  Administered 2014-03-10 (×4): 0.5 mg via INTRAVENOUS

## 2014-03-10 MED ORDER — DEXAMETHASONE SODIUM PHOSPHATE 4 MG/ML IJ SOLN
INTRAMUSCULAR | Status: DC | PRN
  Administered 2014-03-10: 10 mg via INTRAVENOUS

## 2014-03-10 SURGICAL SUPPLY — 65 items
APL SKNCLS STERI-STRIP NONHPOA (GAUZE/BANDAGES/DRESSINGS) ×1
BANDAGE ELASTIC 3 VELCRO ST LF (GAUZE/BANDAGES/DRESSINGS) ×3 IMPLANT
BANDAGE ELASTIC 4 VELCRO ST LF (GAUZE/BANDAGES/DRESSINGS) IMPLANT
BENZOIN TINCTURE PRP APPL 2/3 (GAUZE/BANDAGES/DRESSINGS) ×3 IMPLANT
BIT DRILL 1.1 MINI QC NONSTRL (BIT) ×3 IMPLANT
BLADE SURG 15 STRL LF DISP TIS (BLADE) ×1 IMPLANT
BLADE SURG 15 STRL SS (BLADE) ×3
BNDG CMPR 9X4 STRL LF SNTH (GAUZE/BANDAGES/DRESSINGS) ×1
BNDG CMPR MD 5X2 ELC HKLP STRL (GAUZE/BANDAGES/DRESSINGS) ×1
BNDG ELASTIC 2 VLCR STRL LF (GAUZE/BANDAGES/DRESSINGS) ×3 IMPLANT
BNDG ESMARK 4X9 LF (GAUZE/BANDAGES/DRESSINGS) ×3 IMPLANT
BNDG GAUZE ELAST 4 BULKY (GAUZE/BANDAGES/DRESSINGS) ×3 IMPLANT
BNDG PLASTER X FAST 3X3 WHT LF (CAST SUPPLIES) ×42 IMPLANT
BNDG PLSTR 9X3 FST ST WHT (CAST SUPPLIES) ×14
CANISTER SUCT 1200ML W/VALVE (MISCELLANEOUS) IMPLANT
CLOSURE WOUND 1/2 X4 (GAUZE/BANDAGES/DRESSINGS) ×1
CORDS BIPOLAR (ELECTRODE) ×3 IMPLANT
COVER TABLE BACK 60X90 (DRAPES) ×3 IMPLANT
CUFF TOURNIQUET SINGLE 18IN (TOURNIQUET CUFF) ×3 IMPLANT
DECANTER SPIKE VIAL GLASS SM (MISCELLANEOUS) ×3 IMPLANT
DRAPE EXTREMITY T 121X128X90 (DRAPE) ×3 IMPLANT
DRAPE OEC MINIVIEW 54X84 (DRAPES) ×3 IMPLANT
DRAPE SURG 17X23 STRL (DRAPES) IMPLANT
DURAPREP 26ML APPLICATOR (WOUND CARE) ×3 IMPLANT
GAUZE SPONGE 4X4 16PLY XRAY LF (GAUZE/BANDAGES/DRESSINGS) ×3 IMPLANT
GAUZE XEROFORM 1X8 LF (GAUZE/BANDAGES/DRESSINGS) IMPLANT
GLOVE SURG SYN 8.0 (GLOVE) ×6 IMPLANT
GOWN STRL REUS W/ TWL LRG LVL3 (GOWN DISPOSABLE) ×1 IMPLANT
GOWN STRL REUS W/TWL LRG LVL3 (GOWN DISPOSABLE) ×3
GOWN STRL REUS W/TWL XL LVL3 (GOWN DISPOSABLE) ×9 IMPLANT
NEEDLE HYPO 25X1 1.5 SAFETY (NEEDLE) IMPLANT
NS IRRIG 1000ML POUR BTL (IV SOLUTION) IMPLANT
PACK BASIN DAY SURGERY FS (CUSTOM PROCEDURE TRAY) ×3 IMPLANT
PAD CAST 3X4 CTTN HI CHSV (CAST SUPPLIES) ×1 IMPLANT
PAD CAST 4YDX4 CTTN HI CHSV (CAST SUPPLIES) IMPLANT
PADDING CAST ABS 4INX4YD NS (CAST SUPPLIES) ×2
PADDING CAST ABS COTTON 4X4 ST (CAST SUPPLIES) ×1 IMPLANT
PADDING CAST COTTON 3X4 STRL (CAST SUPPLIES) ×3
PADDING CAST COTTON 4X4 STRL (CAST SUPPLIES)
PADDING UNDERCAST 2  STERILE (CAST SUPPLIES) ×3 IMPLANT
PLATE STRAIGHT LOCK 1.5 (Plate) ×3 IMPLANT
SCREW NL 1.5X12 (Screw) ×3 IMPLANT
SCREW NL 1.5X13 (Screw) ×12 IMPLANT
SCREW NONIOC 1.5 14M (Screw) ×3 IMPLANT
SHEET MEDIUM DRAPE 40X70 STRL (DRAPES) ×3 IMPLANT
SPLINT PLASTER CAST XFAST 4X15 (CAST SUPPLIES) IMPLANT
SPLINT PLASTER XTRA FAST SET 4 (CAST SUPPLIES)
SPONGE GAUZE 4X4 12PLY (GAUZE/BANDAGES/DRESSINGS) ×3 IMPLANT
STOCKINETTE 4X48 STRL (DRAPES) ×3 IMPLANT
STRIP CLOSURE SKIN 1/2X4 (GAUZE/BANDAGES/DRESSINGS) ×2 IMPLANT
SUCTION FRAZIER TIP 10 FR DISP (SUCTIONS) IMPLANT
SUT ETHILON 4 0 PS 2 18 (SUTURE) IMPLANT
SUT ETHILON 5 0 PS 2 18 (SUTURE) IMPLANT
SUT MERSILENE 4 0 P 3 (SUTURE) IMPLANT
SUT VIC AB 4-0 P-3 18XBRD (SUTURE) ×1 IMPLANT
SUT VIC AB 4-0 P3 18 (SUTURE) ×3
SUT VICRYL 4-0 PS2 18IN ABS (SUTURE) ×3 IMPLANT
SUT VICRYL RAPIDE 4-0 (SUTURE) IMPLANT
SUT VICRYL RAPIDE 4/0 PS 2 (SUTURE) ×3 IMPLANT
SYR BULB 3OZ (MISCELLANEOUS) ×3 IMPLANT
SYRINGE 10CC LL (SYRINGE) ×3 IMPLANT
TOWEL OR 17X24 6PK STRL BLUE (TOWEL DISPOSABLE) ×3 IMPLANT
TUBE CONNECTING 20'X1/4 (TUBING)
TUBE CONNECTING 20X1/4 (TUBING) IMPLANT
UNDERPAD 30X30 INCONTINENT (UNDERPADS AND DIAPERS) ×3 IMPLANT

## 2014-03-10 NOTE — H&P (Signed)
Troy Mcgee is an 28 y.o. male.   Chief Complaint: riight habd pain HPI: as above s/p right hand trauma  Past Medical History  Diagnosis Date  . Metacarpal bone fracture 03/03/2014    right ring    Past Surgical History  Procedure Laterality Date  . Hypospadias correction  age [age]    History reviewed. No pertinent family history. Social History:  reports that he has been smoking Cigarettes.  He has been smoking about 0.00 packs per day for the past 8 years. He has never used smokeless tobacco. He reports that he drinks alcohol. He reports that he does not use illicit drugs.  Allergies:  Allergies  Allergen Reactions  . Eggs Or Egg-Derived Products Other (See Comments)    ITCHING OF THROAT    No prescriptions prior to admission    Results for orders placed during the hospital encounter of 03/10/14 (from the past 48 hour(s))  POCT HEMOGLOBIN-HEMACUE     Status: None   Collection Time    03/10/14 10:35 AM      Result Value Ref Range   Hemoglobin 15.4  13.0 - 17.0 g/dL   No results found.  Review of Systems  All other systems reviewed and are negative.    Blood pressure 138/85, pulse 69, temperature 98.5 F (36.9 C), temperature source Oral, resp. rate 16, height 5\' 9"  (1.753 m), weight 89.926 kg (198 lb 4 oz), SpO2 100.00%. Physical Exam  Constitutional: He appears well-developed and well-nourished.  HENT:  Head: Normocephalic and atraumatic.  Cardiovascular: Normal rate.   Respiratory: Effort normal.  Musculoskeletal:       Right hand: He exhibits bony tenderness and deformity.  Dorsally displaced right ring metacarpal fracture  Skin: Skin is warm.  Psychiatric: He has a normal mood and affect. His behavior is normal. Judgment and thought content normal.     Assessment/Plan As above  Plan ORIF  Monroe Qin A 03/10/2014, 11:26 AM

## 2014-03-10 NOTE — Transfer of Care (Signed)
Immediate Anesthesia Transfer of Care Note  Patient: Troy Mcgee  Procedure(s) Performed: Procedure(s): OPEN REDUCTION INTERNAL FIXATION (ORIF) RIGHT HAND RING METACARPAL FRACTURE (Right)  Patient Location: PACU  Anesthesia Type:General  Level of Consciousness: awake, sedated and patient cooperative  Airway & Oxygen Therapy: Patient Spontanous Breathing and Patient connected to face mask oxygen  Post-op Assessment: Report given to PACU RN and Post -op Vital signs reviewed and stable  Post vital signs: Reviewed and stable  Complications: No apparent anesthesia complications

## 2014-03-10 NOTE — Op Note (Signed)
See note [id number]

## 2014-03-10 NOTE — Discharge Instructions (Signed)

## 2014-03-10 NOTE — Anesthesia Postprocedure Evaluation (Signed)
  Anesthesia Post-op Note  Patient: Troy Mcgee  Procedure(s) Performed: Procedure(s): OPEN REDUCTION INTERNAL FIXATION (ORIF) RIGHT HAND RING METACARPAL FRACTURE (Right)  Patient Location: PACU  Anesthesia Type:General  Level of Consciousness: awake and alert   Airway and Oxygen Therapy: Patient Spontanous Breathing  Post-op Pain: mild  Post-op Assessment: Post-op Vital signs reviewed  Post-op Vital Signs: stable  Complications: No apparent anesthesia complications

## 2014-03-10 NOTE — Anesthesia Preprocedure Evaluation (Signed)
Anesthesia Evaluation  Patient identified by MRN, date of birth, ID band Patient awake    Reviewed: Allergy & Precautions, H&P , NPO status , Patient's Chart, lab work & pertinent test results  History of Anesthesia Complications Negative for: history of anesthetic complications  Airway Mallampati: I      Dental no notable dental hx. (+) Teeth Intact   Pulmonary neg pulmonary ROS, Current Smoker,  breath sounds clear to auscultation  Pulmonary exam normal       Cardiovascular negative cardio ROS  IRhythm:regular Rate:Normal     Neuro/Psych negative neurological ROS  negative psych ROS   GI/Hepatic negative GI ROS, Neg liver ROS,   Endo/Other  negative endocrine ROS  Renal/GU negative Renal ROS  negative genitourinary   Musculoskeletal negative musculoskeletal ROS (+)   Abdominal   Peds negative pediatric ROS (+)  Hematology negative hematology ROS (+)   Anesthesia Other Findings   Reproductive/Obstetrics negative OB ROS                           Anesthesia Physical Anesthesia Plan  ASA: I  Anesthesia Plan: General and General LMA   Post-op Pain Management:    Induction:   Airway Management Planned:   Additional Equipment:   Intra-op Plan:   Post-operative Plan:   Informed Consent: I have reviewed the patients History and Physical, chart, labs and discussed the procedure including the risks, benefits and alternatives for the proposed anesthesia with the patient or authorized representative who has indicated his/her understanding and acceptance.     Plan Discussed with: CRNA and Surgeon  Anesthesia Plan Comments:         Anesthesia Quick Evaluation

## 2014-03-13 ENCOUNTER — Encounter (HOSPITAL_BASED_OUTPATIENT_CLINIC_OR_DEPARTMENT_OTHER): Payer: Self-pay | Admitting: Orthopedic Surgery

## 2014-03-13 NOTE — Op Note (Signed)
NAMMauricia Mcgee:  Dunkel, Lucero                 ACCOUNT NO.:  [id number]  MEDICAL RECORD NO.:  [id number]  LOCATION:                                 FACILITY:  PHYSICIAN:  Artist PaisMatthew A. Branden Vine, M.D.DATE OF BIRTH:  09/26/1986  DATE OF PROCEDURE:  03/10/2014 DATE OF DISCHARGE:  03/10/2014                              OPERATIVE REPORT   PREOPERATIVE DIAGNOSIS:  Displaced right ring finger metacarpal fracture.  POSTOPERATIVE DIAGNOSIS:  Displaced right ring finger metacarpal fracture.  PROCEDURES:  Open reduction and internal fixation of above.  SURGEON:  Artist PaisMatthew A. Mina MarbleWeingold, M.D.  ASSISTANT:  None.  ANESTHESIA:  General.  COMPLICATIONS:  None.  DRAINS:  None.  DESCRIPTION OF PROCEDURE:  The patient was taken to the operating suite after the induction of adequate general anesthetic.  Right upper extremity was prepped and draped in sterile fashion.  An Esmarch was used to exsanguinate the limb.  Tourniquet was inflated to 250 mmHg.  At this point of time, an incision made over the dorsal aspect of the right hand in line with the ring metacarpal.  Skin was incised.  The EDC tendons were retracted ulnarly.  The subperiosteal dissection.  Fracture site was undertaken.  The fracture site was debrided of clot.  Reduction was performed.  A __________ 1.5 mm ALPS plate was placed dorsally with 3 cortical screws proximal and 3 cortical screws distal to the fracture site, placed in the routine fashion.  Intraoperative fluoroscopy revealed adequate reduction in AP, lateral, and oblique view.  The wound was irrigated and closed loosely with 4-0 Vicryl to reapproximate the deeper tissues and cover the plate and 4-0 Vicryl Rapide subcuticular stitch on the skin.  Steri-Strips, 4x4s, fluffs, and a volar splint was applied.  The patient tolerated the procedure well in a concealed fashion.     Artist PaisMatthew A. Mina MarbleWeingold, M.D.     MAW/MEDQ  D:  03/10/2014  T:  03/11/2014  Job:  [id number]

## 2019-09-01 ENCOUNTER — Ambulatory Visit: Payer: Self-pay

## 2019-09-06 ENCOUNTER — Other Ambulatory Visit: Payer: Self-pay

## 2019-09-06 ENCOUNTER — Ambulatory Visit: Payer: Self-pay | Admitting: Physician Assistant

## 2019-09-06 DIAGNOSIS — Z202 Contact with and (suspected) exposure to infections with a predominantly sexual mode of transmission: Secondary | ICD-10-CM

## 2019-09-06 DIAGNOSIS — Z113 Encounter for screening for infections with a predominantly sexual mode of transmission: Secondary | ICD-10-CM

## 2019-09-06 MED ORDER — AZITHROMYCIN 500 MG PO TABS
1000.0000 mg | ORAL_TABLET | Freq: Once | ORAL | Status: AC
  Administered 2019-09-06: 16:00:00 1000 mg via ORAL

## 2019-09-06 MED ORDER — METRONIDAZOLE 500 MG PO TABS: 500.0000 mg | ORAL_TABLET | Freq: Once | ORAL | 0 refills | Status: AC

## 2019-09-06 NOTE — Progress Notes (Signed)
Declines STD screening. States he is here today as a contact to Trich and Chlamydia. Hal Morales, RN

## 2019-09-07 ENCOUNTER — Encounter: Payer: Self-pay | Admitting: Physician Assistant

## 2019-09-07 NOTE — Progress Notes (Signed)
    STI clinic/screening visit  Subjective:  Troy Mcgee is a 33 y.o. male being seen today for an STI screening visit. The patient reports they do not have symptoms.  Patient has the following medical conditions:  There are no active problems to display for this patient.    Chief Complaint  Patient presents with  . SEXUALLY TRANSMITTED DISEASE    Contact to Trich and Chlamydia    HPI  Patient reports that he is a contact to Chlamydia and Trich.  Denies any symptoms and declines screening exam and blood work today.  Requests treatment as a contact only.  See flowsheet for further details and programmatic requirements.    The following portions of the patient's history were reviewed and updated as appropriate: allergies, current medications, past medical history, past social history, past surgical history and problem list.  Objective:  There were no vitals filed for this visit.  Physical Exam Constitutional:      General: He is not in acute distress.    Appearance: Normal appearance.  HENT:     Head: Normocephalic and atraumatic.  Pulmonary:     Effort: Pulmonary effort is normal.  Neurological:     Mental Status: He is alert and oriented to person, place, and time.  Psychiatric:        Mood and Affect: Mood normal.        Behavior: Behavior normal.        Thought Content: Thought content normal.        Judgment: Judgment normal.       Assessment and Plan:  NAITHEN RIVENBURG is a 33 y.o. male presenting to the [facility] Department for STI screening  1. Screening for STD (sexually transmitted disease) Patient is without symptoms today.  Declines blood work and screening and requests treatment as a contact only. Rec condoms with all sex. RTC as needed.  2. Venereal disease contact Will treat with Azithromycin 1g po DOT today.   Will give Metronidazole 2 g po with food to take tomorrow.  No EtOH for 24 hr before and until 72 hr after completing  medicine. No sex for 7 days and until after partner completes treatment RTC if vomits <2 hr after completing medicine. - azithromycin (ZITHROMAX) tablet 1,000 mg - metroNIDAZOLE (FLAGYL) 500 MG tablet;Take 4 tablets by mouth once for 1 dose  Dispense: 4 tablet; Refill: 0     No follow-ups on file.  No future appointments.  Jerene Dilling, PA

## 2022-04-19 ENCOUNTER — Emergency Department
Admission: EM | Admit: 2022-04-19 | Discharge: 2022-04-19 | Disposition: A | Discharge status: Home / Self Care | Payer: 59 | Attending: Emergency Medicine | Admitting: Emergency Medicine

## 2022-04-19 ENCOUNTER — Emergency Department: Payer: 59

## 2022-04-19 DIAGNOSIS — M542 Cervicalgia: Secondary | ICD-10-CM | POA: Diagnosis not present

## 2022-04-19 DIAGNOSIS — R079 Chest pain, unspecified: Secondary | ICD-10-CM | POA: Diagnosis not present

## 2022-04-19 DIAGNOSIS — R519 Headache, unspecified: Secondary | ICD-10-CM

## 2022-04-19 DIAGNOSIS — G44209 Tension-type headache, unspecified, not intractable: Secondary | ICD-10-CM | POA: Insufficient documentation

## 2022-04-19 LAB — COMPREHENSIVE METABOLIC PANEL
ALT: 36 U/L (ref 0–44)
AST: 26 U/L (ref 15–41)
Albumin: 4.2 g/dL (ref 3.5–5.0)
Alkaline Phosphatase: 65 U/L (ref 38–126)
Anion gap: 6 (ref 5–15)
BUN: 21 mg/dL — ABNORMAL HIGH (ref 6–20)
CO2: 28 mmol/L (ref 22–32)
Calcium: 8.9 mg/dL (ref 8.9–10.3)
Chloride: 105 mmol/L (ref 98–111)
Creatinine, Ser: 1.12 mg/dL (ref 0.61–1.24)
GFR, Estimated: >60 mL/min (ref >60)
Glucose, Bld: 138 mg/dL — ABNORMAL HIGH (ref 70–99)
Potassium: 3.9 mmol/L (ref 3.5–5.1)
Sodium: 139 mmol/L (ref 135–145)
Total Bilirubin: 0.5 mg/dL (ref 0.3–1.2)
Total Protein: 8 g/dL (ref 6.5–8.1)

## 2022-04-19 LAB — CBC WITH DIFFERENTIAL/PLATELET
Abs Immature Granulocytes: 0.04 10*3/uL (ref 0.00–0.07)
Basophils Absolute: 0.2 10*3/uL — ABNORMAL HIGH (ref 0.0–0.1)
Basophils Relative: 1 %
Eosinophils Absolute: 0.5 10*3/uL (ref 0.0–0.5)
Eosinophils Relative: 5 %
HCT: 43.3 % (ref 39.0–52.0)
Hemoglobin: 13.9 g/dL (ref 13.0–17.0)
Immature Granulocytes: 0 %
Lymphocytes Relative: 30 %
Lymphs Abs: 3.2 10*3/uL (ref 0.7–4.0)
MCH: 30.6 pg (ref 26.0–34.0)
MCHC: 32.1 g/dL (ref 30.0–36.0)
MCV: 95.4 fL (ref 80.0–100.0)
Monocytes Absolute: 1.1 10*3/uL — ABNORMAL HIGH (ref 0.1–1.0)
Monocytes Relative: 10 %
Neutro Abs: 5.7 10*3/uL (ref 1.7–7.7)
Neutrophils Relative %: 54 %
Platelets: 336 10*3/uL (ref 150–400)
RBC: 4.54 MIL/uL (ref 4.22–5.81)
RDW: 13.1 % (ref 11.5–15.5)
WBC: 10.8 10*3/uL — ABNORMAL HIGH (ref 4.0–10.5)
nRBC: 0 % (ref 0.0–0.2)

## 2022-04-19 LAB — LIPASE, BLOOD: Lipase: 34 U/L (ref 11–51)

## 2022-04-19 LAB — TROPONIN I (HIGH SENSITIVITY): Troponin I (High Sensitivity): 8 ng/L (ref <18)

## 2022-04-19 MED ORDER — SODIUM CHLORIDE 0.9 % IV BOLUS
1000.0000 mL | Freq: Once | INTRAVENOUS | Status: AC
  Administered 2022-04-19: 1000 mL via INTRAVENOUS

## 2022-04-19 MED ORDER — LORAZEPAM 2 MG/ML IJ SOLN
0.5000 mg | Freq: Once | INTRAMUSCULAR | Status: AC
  Administered 2022-04-19: 0.5 mg via INTRAVENOUS
  Filled 2022-04-19: qty 1

## 2022-04-19 MED ORDER — DIAZEPAM 5 MG PO TABS: 5.0000 mg | ORAL_TABLET | Freq: Three times a day (TID) | ORAL | 0 refills | Status: AC | PRN

## 2022-04-19 MED ORDER — IOHEXOL 350 MG/ML SOLN
75.0000 mL | Freq: Once | INTRAVENOUS | Status: AC | PRN
Start: 2022-04-19 — End: 2022-04-19
  Administered 2022-04-19: 75 mL via INTRAVENOUS

## 2022-04-19 MED ORDER — METOCLOPRAMIDE HCL 5 MG/ML IJ SOLN
5.0000 mg | Freq: Once | INTRAMUSCULAR | Status: AC
  Administered 2022-04-19: 5 mg via INTRAVENOUS
  Filled 2022-04-19: qty 2

## 2022-04-19 MED ORDER — METOCLOPRAMIDE HCL 10 MG PO TABS: 10.0000 mg | ORAL_TABLET | Freq: Three times a day (TID) | ORAL | 0 refills | Status: AC | PRN

## 2022-04-19 NOTE — Discharge Instructions (Signed)
You may take Reglan as needed for headache, Valium as needed for muscle spasm/tension.  Return to the ER for worsening symptoms, persistent vomiting, lethargy or other concerns. ?

## 2022-04-19 NOTE — ED Triage Notes (Signed)
Pt reports sudden neck pain that awoke him from bed. He rates having some chest indigestion of a 3/10 at this time.  ?

## 2022-04-19 NOTE — ED Provider Notes (Signed)
? ?[facility] ?Provider Note ? ? ? Event Date/Time  ? First MD Initiated Contact with Patient 04/19/22 0359   ?  (approximate) ? ? ?History  ? ?Neck Pain and Chest Pain ? ? ?HPI ? ?Troy Mcgee is a 36 y.o. male brought to the ED via EMS from home with a chief complaint of headache, neck pain and now chest pain.  Patient reports prior history of migraine headaches, none in quite some time.  States he awoke with sudden pain at the base of his head and neck; symptoms associated with mild photophobia.  Reports chest indigestion during his ambulance ride to the ED.  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  Denies recent fall/trauma/injury.  Denies recent illness.  Denies fever, shortness of breath, abdominal pain, nausea, vomiting or dizziness. ?  ? ? ?Past Medical History  ? ?Past Medical History:  ?Diagnosis Date  ? Metacarpal bone fracture 03/03/2014  ? right ring  ? ? ? ?Active Problem List  ?There are no problems to display for this patient. ? ? ? ?Past Surgical History  ? ?Past Surgical History:  ?Procedure Laterality Date  ? HYPOSPADIAS CORRECTION  age [age]  ? OPEN REDUCTION INTERNAL FIXATION (ORIF) METACARPAL Right 03/10/2014  ? Procedure: OPEN REDUCTION INTERNAL FIXATION (ORIF) RIGHT HAND RING METACARPAL FRACTURE;  Surgeon: Marlowe Shores, MD;  Location: [facility];  Service: Orthopedics;  Laterality: Right;  ? ? ? ?Home Medications  ? ?Prior to Admission medications   ?Medication Sig Start Date End Date Taking? Authorizing Provider  ?diazepam (VALIUM) 5 MG tablet Take 1 tablet (5 mg total) by mouth every 8 (eight) hours as needed for anxiety. 04/19/22  Yes Irean Hong, MD  ?metoCLOPramide (REGLAN) 10 MG tablet Take 1 tablet (10 mg total) by mouth every 8 (eight) hours as needed for nausea (headache). 04/19/22  Yes Irean Hong, MD  ?oxyCODONE-acetaminophen (ROXICET) 5-325 MG per tablet Take 1 tablet by mouth every 4 (four) hours as needed for severe  pain. 03/10/14   Dairl Ponder, MD  ? ? ? ?Allergies  ?Eggs or egg-derived products ? ? ?Family History  ?No family history on file. ? ? ?Physical Exam  ?Triage Vital Signs: ?ED Triage Vitals  ?Enc Vitals Group  ?   BP   ?   Pulse   ?   Resp   ?   Temp   ?   Temp src   ?   SpO2   ?   Weight   ?   Height   ?   Head Circumference   ?   Peak Flow   ?   Pain Score   ?   Pain Loc   ?   Pain Edu?   ?   Excl. in GC?   ? ? ?Updated Vital Signs: ?BP 132/62   Pulse 82   Temp 98.1 ?F (36.7 ?C)   Resp 18   Wt 128.4 kg   SpO2 95%   BMI 41.80 kg/m?  ? ? ?General: Awake, no distress.  ?CV:  RRR.  Good peripheral perfusion.  ?Resp:  Normal effort.  CTA B ?Abd:  Nontender.  No distention.  ?Other:  PERRL.  EOMI alert and oriented x3.  CN II toXII grossly intact.  5/5 motor strength in sensation all extremities. MAEx4.  Ambulatory with steady gait.  No carotid bruits.  Supple neck without meningismus.  Bilateral trapezius muscle spasms.  No  petechiae. ? ? ?ED Results / Procedures / Treatments  ?Labs ?(all labs ordered are listed, but only abnormal results are displayed) ?Labs Reviewed  ?CBC WITH DIFFERENTIAL/PLATELET - Abnormal; Notable for the following components:  ?    Result Value  ? WBC 10.8 (*)   ? Monocytes Absolute 1.1 (*)   ? Basophils Absolute 0.2 (*)   ? All other components within normal limits  ?COMPREHENSIVE METABOLIC PANEL - Abnormal; Notable for the following components:  ? Glucose, Bld 138 (*)   ? BUN 21 (*)   ? All other components within normal limits  ?LIPASE, BLOOD  ?TROPONIN I (HIGH SENSITIVITY)  ? ? ? ?EKG ? ?ED ECG REPORT ?I, Irean Hong, the attending physician, personally viewed and interpreted this ECG. ? ? Date: 04/19/2022 ? EKG Time: 0408 ? Rate: 87 ? Rhythm: normal sinus rhythm ? Axis: Normal ? Intervals:none ? ST&T Change: Nonspecific ? ? ? ?RADIOLOGY ?I have independently visualized and interpreted patient's chest x-ray, CT head and neck as well as noted the radiology interpretation: ? ?Chest  x-ray: No acute cardiopulmonary process ? ?CTA head: Negative ? ?CTA neck: Negative ? ?Official radiology report(s): ?CT ANGIO HEAD NECK W WO CM ? ?Result Date: 04/19/2022 ?CLINICAL DATA:  Headache, severe and sudden. Neck pain that awoke the patient from bed. EXAM: CT ANGIOGRAPHY HEAD AND NECK TECHNIQUE: Multidetector CT imaging of the head and neck was performed using the standard protocol during bolus administration of intravenous contrast. Multiplanar CT image reconstructions and MIPs were obtained to evaluate the vascular anatomy. Carotid stenosis measurements (when applicable) are obtained utilizing NASCET criteria, using the distal internal carotid diameter as the denominator. RADIATION DOSE REDUCTION: This exam was performed according to the departmental dose-optimization program which includes automated exposure control, adjustment of the mA and/or kV according to patient size and/or use of iterative reconstruction technique. CONTRAST:  39mL OMNIPAQUE IOHEXOL 350 MG/ML SOLN COMPARISON:  Head CT 02/12/2013 FINDINGS: CT HEAD FINDINGS Brain: No evidence of acute infarction, hemorrhage, hydrocephalus, extra-axial collection or mass lesion/mass effect. Vascular: See below Skull: Normal. Negative for fracture or focal lesion. Sinuses: Negative Orbits: Negative Review of the MIP images confirms the above findings CTA NECK FINDINGS Aortic arch: Unremarkable. Right carotid system: Common and internal carotid arteries are smoothly contoured and widely patent. No atheromatous changes. Left carotid system: Common and internal carotid arteries are smoothly contoured and widely patent. No atheromatous changes. Vertebral arteries: Subclavian and vertebral arteries are smoothly contoured and widely patent. No atheromatous changes. Skeleton: No acute finding.  No visible cord impingement Other neck: No acute finding. Upper chest: Airway thickening at the apices. Review of the MIP images confirms the above findings CTA HEAD  FINDINGS Anterior circulation: No significant stenosis, proximal occlusion, aneurysm, or vascular malformation. Posterior circulation: No significant stenosis, proximal occlusion, aneurysm, or vascular malformation. Venous sinuses: Diffusely patent Anatomic variants: None significant Review of the MIP images confirms the above findings IMPRESSION: Negative CTA of the head and neck. Electronically Signed   By: Tiburcio Pea M.D.   On: 04/19/2022 05:51  ? ?DG Chest Port 1 View ? ?Result Date: 04/19/2022 ?CLINICAL DATA:  Sudden neck pain EXAM: PORTABLE CHEST 1 VIEW COMPARISON:  05/10/2012 FINDINGS: Normal heart size and mediastinal contours. No acute infiltrate or edema. Few calcified granulomas in the bilateral lungs, versus vessels seen on end. No effusion or pneumothorax. No acute osseous findings. IMPRESSION: No active disease. Electronically Signed   By: Tiburcio Pea M.D.   On: 04/19/2022 04:58   ? ? ?  PROCEDURES: ? ?Critical Care performed: No ? ?.1-3 Lead EKG Interpretation ?Performed by: Irean HongSung, Ryon Layton J, MD ?Authorized by: Irean HongSung, Leilah Polimeni J, MD  ? ?  Interpretation: normal   ?  ECG rate:  90 ?  ECG rate assessment: normal   ?  Rhythm: sinus rhythm   ?  Ectopy: none   ?  Conduction: normal   ?Comments:  ?   Patient placed on cardiac monitor to evaluate for arrhythmias ? ? ?MEDICATIONS ORDERED IN ED: ?Medications  ?sodium chloride 0.9 % bolus 1,000 mL (1,000 mLs Intravenous New Bag/Given 04/19/22 0424)  ?metoCLOPramide (REGLAN) injection 5 mg (5 mg Intravenous Given 04/19/22 0421)  ?LORazepam (ATIVAN) injection 0.5 mg (0.5 mg Intravenous Given 04/19/22 0421)  ?iohexol (OMNIPAQUE) 350 MG/ML injection 75 mL (75 mLs Intravenous Contrast Given 04/19/22 0528)  ? ? ? ?IMPRESSION / MDM / ASSESSMENT AND PLAN / ED COURSE  ?I reviewed the triage vital signs and the nursing notes. ?             ?               ?36 year old male presenting with headache and neck pain; chest pain in route to the ED. Differential diagnosis includes,  but is not limited to, intracranial hemorrhage, meningitis/encephalitis, previous head trauma, cavernous venous thrombosis, tension headache, temporal arteritis, migraine or migraine equivalent, idiopathic intr

## 2022-08-29 ENCOUNTER — Encounter: Payer: Self-pay | Admitting: Emergency Medicine

## 2022-08-29 ENCOUNTER — Emergency Department: Payer: 59

## 2022-08-29 ENCOUNTER — Observation Stay
Admission: EM | Admit: 2022-08-29 | Discharge: 2022-08-31 | Disposition: A | Discharge status: Home / Self Care | Payer: 59 | Attending: Surgery | Admitting: Surgery

## 2022-08-29 ENCOUNTER — Other Ambulatory Visit: Payer: Self-pay

## 2022-08-29 DIAGNOSIS — F17210 Nicotine dependence, cigarettes, uncomplicated: Secondary | ICD-10-CM | POA: Insufficient documentation

## 2022-08-29 DIAGNOSIS — K439 Ventral hernia without obstruction or gangrene: Secondary | ICD-10-CM

## 2022-08-29 DIAGNOSIS — K3580 Unspecified acute appendicitis: Secondary | ICD-10-CM | POA: Insufficient documentation

## 2022-08-29 DIAGNOSIS — R1013 Epigastric pain: Secondary | ICD-10-CM

## 2022-08-29 DIAGNOSIS — K436 Other and unspecified ventral hernia with obstruction, without gangrene: Principal | ICD-10-CM | POA: Insufficient documentation

## 2022-08-29 LAB — COMPREHENSIVE METABOLIC PANEL
ALT: 35 U/L (ref 0–44)
AST: 24 U/L (ref 15–41)
Albumin: 4.3 g/dL (ref 3.5–5.0)
Alkaline Phosphatase: 70 U/L (ref 38–126)
Anion gap: 9 (ref 5–15)
BUN: 14 mg/dL (ref 6–20)
CO2: 27 mmol/L (ref 22–32)
Calcium: 9.1 mg/dL (ref 8.9–10.3)
Chloride: 100 mmol/L (ref 98–111)
Creatinine, Ser: 1.03 mg/dL (ref 0.61–1.24)
GFR, Estimated: >60 mL/min (ref >60)
Glucose, Bld: 115 mg/dL — ABNORMAL HIGH (ref 70–99)
Potassium: 3.7 mmol/L (ref 3.5–5.1)
Sodium: 136 mmol/L (ref 135–145)
Total Bilirubin: 0.7 mg/dL (ref 0.3–1.2)
Total Protein: 8.3 g/dL — ABNORMAL HIGH (ref 6.5–8.1)

## 2022-08-29 LAB — URINALYSIS, ROUTINE W REFLEX MICROSCOPIC
Bilirubin Urine: NEGATIVE
Glucose, UA: NEGATIVE mg/dL
Hgb urine dipstick: NEGATIVE
Ketones, ur: NEGATIVE mg/dL
Leukocytes,Ua: NEGATIVE
Nitrite: NEGATIVE
Protein, ur: NEGATIVE mg/dL
Specific Gravity, Urine: 1.025 (ref 1.005–1.030)
pH: 6 (ref 5.0–8.0)

## 2022-08-29 LAB — CBC
HCT: 43.6 % (ref 39.0–52.0)
Hemoglobin: 14.2 g/dL (ref 13.0–17.0)
MCH: 30.7 pg (ref 26.0–34.0)
MCHC: 32.6 g/dL (ref 30.0–36.0)
MCV: 94.4 fL (ref 80.0–100.0)
Platelets: 307 10*3/uL (ref 150–400)
RBC: 4.62 MIL/uL (ref 4.22–5.81)
RDW: 13.5 % (ref 11.5–15.5)
WBC: 12.4 10*3/uL — ABNORMAL HIGH (ref 4.0–10.5)
nRBC: 0 % (ref 0.0–0.2)

## 2022-08-29 LAB — LIPASE, BLOOD: Lipase: 31 U/L (ref 11–51)

## 2022-08-29 MED ORDER — IOHEXOL 300 MG/ML  SOLN
100.0000 mL | Freq: Once | INTRAMUSCULAR | Status: AC | PRN
  Administered 2022-08-29: 100 mL via INTRAVENOUS

## 2022-08-29 NOTE — ED Triage Notes (Signed)
Patient to ED for abd pain-epigastric for past 3 days. Denies N/V/D, Sent by UC because concern about knot above belly button. Patient also c/o right shoulder pain- ongoing for the past couple of months.

## 2022-08-29 NOTE — ED Provider Notes (Signed)
[facility] Provider Note    Event Date/Time   First MD Initiated Contact with Patient 08/29/22 2037     (approximate)   History   Abdominal Pain   HPI  Troy Mcgee is a 36 y.o. male who comes in complaining of epigastric pain for about the last 3 days.  No nausea or vomiting.  He has had this before once sometime ago and sometimes he has pain in the right shoulder.  He has had a knot above his bellybutton has been present there for some time.  This is tender.      Physical Exam   Triage Vital Signs: ED Triage Vitals  Enc Vitals Group     BP 08/29/22 1636 (!) 154/123     Pulse Rate 08/29/22 1636 92     Resp 08/29/22 1636 18     Temp 08/29/22 1636 99.8 F (37.7 C)     Temp Source 08/29/22 1636 Oral     SpO2 08/29/22 1636 97 %     Weight 08/29/22 1638 284 lb (128.8 kg)     Height 08/29/22 1638 5\' 9"  (1.753 m)     Head Circumference --      Peak Flow --      Pain Score 08/29/22 1638 4     Pain Loc --      Pain Edu? --      Excl. in GC? --     Most recent vital signs: Vitals:   08/29/22 2052 08/29/22 2340  BP: (!) 168/86 (!) 158/83  Pulse: 89 88  Resp: 18 18  Temp: 98.7 F (37.1 C)   SpO2: 100% 99%     General: Awake, no distress.  CV:  Good peripheral perfusion.  Heart regular rate and rhythm no audible murmurs Resp:  Normal effort.  Lungs are clear Abd:  No distention.  Abdomen is soft there is some mild discomfort in the upper abdomen and the tender lump in the midline about 6 inches above his umbilicus which is the size of a quarter firm but not rigid and tender Extremities no edema   ED Results / Procedures / Treatments   Labs (all labs ordered are listed, but only abnormal results are displayed) Labs Reviewed  COMPREHENSIVE METABOLIC PANEL - Abnormal; Notable for the following components:      Result Value   Glucose, Bld 115 (*)    Total Protein 8.3 (*)    All other components within normal limits  CBC - Abnormal;  Notable for the following components:   WBC 12.4 (*)    All other components within normal limits  URINALYSIS, ROUTINE W REFLEX MICROSCOPIC - Abnormal; Notable for the following components:   Color, Urine YELLOW (*)    APPearance CLEAR (*)    All other components within normal limits  LIPASE, BLOOD  DIFFERENTIAL     EKG     RADIOLOGY Ultrasound read by radiology reviewed by me shows no acute pathology there is a fatty liver CT read by radiology reviewed by me shows acute appendicitis and ventral hernia containing only fat  PROCEDURES:  Critical Care performed:   Procedures   MEDICATIONS ORDERED IN ED: Medications  iohexol (OMNIPAQUE) 300 MG/ML solution 100 mL (100 mLs Intravenous Contrast Given 08/29/22 2241)     IMPRESSION / MDM / ASSESSMENT AND PLAN / ED COURSE  I reviewed the triage vital signs and the nursing notes. Gust the patient with Dr. 10/29/22 who is coming of also  notified the patient of this.  Torrential diagnosis includes gastritis or ulcer cholecystitis hepatitis splenic infarction splenic artery aneurysm rupture enteric adenitis arterial insufficiency of the bowel villous intussusception  Patient's presentation is most consistent with acute presentation with potential threat to life or bodily function.  he patient is on the cardiac monitor to evaluate for evidence of arrhythmia and/or significant heart rate changes.     FINAL CLINICAL IMPRESSION(S) / ED DIAGNOSES   Final diagnoses:  Epigastric pain  Acute appendicitis, unspecified acute appendicitis type  Ventral hernia without obstruction or gangrene     Rx / DC Orders   ED Discharge Orders     None        Note:  This document was prepared using Dragon voice recognition software and may include unintentional dictation errors.   Arnaldo Natal, MD 08/30/22 [phone]

## 2022-08-29 NOTE — ED Provider Triage Note (Addendum)
  Emergency Medicine Provider Triage Evaluation Note  Troy Mcgee , a 36 y.o.male,  was evaluated in triage.  Pt complains of right upper quadrant/epigastric pain x3 days, associated with some right shoulder pain as well..  Additionally states that he has a knot immediately above his bellybutton that he is concerned about.  He was referred to the ED from urgent care.   Review of Systems  Positive: Abdominal pain, shoulder pain Negative: Denies fever, chest pain, vomiting  Physical Exam   Vitals:   08/29/22 1636 08/29/22 1638  BP: (!) 154/123 (!) 171/97  Pulse: 92   Resp: 18   Temp: 99.8 F (37.7 C)   SpO2: 97%    Gen:   Awake, no distress   Resp:  Normal effort  MSK:   Moves extremities without difficulty  Other:  Tenderness in the epigastric and right upper quadrant.   Medical Decision Making  Given the patient's initial medical screening exam, the following diagnostic evaluation has been ordered. The patient will be placed in the appropriate treatment space, once one is available, to complete the evaluation and treatment. I have discussed the plan of care with the patient and I have advised the patient that an ED physician or mid-level practitioner will reevaluate their condition after the test results have been received, as the results may give them additional insight into the type of treatment they may need.    Diagnostics: Labs, right upper quadrant ultrasound, UA  Treatments: none immediately   Varney Daily, PA 08/29/22 1701    Varney Daily, [place] 08/29/22 1712

## 2022-08-30 ENCOUNTER — Encounter: Payer: Self-pay | Admitting: Surgery

## 2022-08-30 ENCOUNTER — Other Ambulatory Visit: Payer: Self-pay

## 2022-08-30 ENCOUNTER — Observation Stay: Payer: 59 | Admitting: Anesthesiology

## 2022-08-30 ENCOUNTER — Encounter
Admission: EM | Disposition: A | Discharge status: Home / Self Care | Payer: Self-pay | Source: Home / Self Care | Attending: Emergency Medicine

## 2022-08-30 DIAGNOSIS — K436 Other and unspecified ventral hernia with obstruction, without gangrene: Secondary | ICD-10-CM | POA: Diagnosis present

## 2022-08-30 HISTORY — PX: INSERTION OF MESH: SHX5868

## 2022-08-30 HISTORY — PX: XI ROBOTIC ASSISTED VENTRAL HERNIA: SHX6789

## 2022-08-30 HISTORY — PX: XI ROBOTIC LAPAROSCOPIC ASSISTED APPENDECTOMY: SHX6877

## 2022-08-30 LAB — CBC
HCT: 40.8 % (ref 39.0–52.0)
Hemoglobin: 13.1 g/dL (ref 13.0–17.0)
MCH: 30.3 pg (ref 26.0–34.0)
MCHC: 32.1 g/dL (ref 30.0–36.0)
MCV: 94.2 fL (ref 80.0–100.0)
Platelets: 296 10*3/uL (ref 150–400)
RBC: 4.33 MIL/uL (ref 4.22–5.81)
RDW: 13.4 % (ref 11.5–15.5)
WBC: 18 10*3/uL — ABNORMAL HIGH (ref 4.0–10.5)
nRBC: 0 % (ref 0.0–0.2)

## 2022-08-30 LAB — SURGICAL PCR SCREEN
MRSA, PCR: NEGATIVE
Staphylococcus aureus: POSITIVE — AB

## 2022-08-30 LAB — CREATININE, SERUM
Creatinine, Ser: 0.97 mg/dL (ref 0.61–1.24)
GFR, Estimated: >60 mL/min (ref >60)

## 2022-08-30 LAB — HIV ANTIBODY (ROUTINE TESTING W REFLEX): HIV Screen 4th Generation wRfx: NONREACTIVE

## 2022-08-30 SURGERY — APPENDECTOMY, ROBOT-ASSISTED, LAPAROSCOPIC
Anesthesia: General

## 2022-08-30 MED ORDER — PROMETHAZINE HCL 25 MG/ML IJ SOLN: 6.2500 mg | INTRAMUSCULAR | Status: DC | PRN

## 2022-08-30 MED ORDER — MORPHINE SULFATE (PF) 2 MG/ML IV SOLN
2.0000 mg | INTRAVENOUS | Status: DC | PRN
  Administered 2022-08-30: 2 mg via INTRAVENOUS
  Filled 2022-08-30 (×2): qty 1

## 2022-08-30 MED ORDER — PROPOFOL 10 MG/ML IV BOLUS
INTRAVENOUS | Status: DC | PRN
  Administered 2022-08-30: 200 mg via INTRAVENOUS

## 2022-08-30 MED ORDER — ENOXAPARIN SODIUM 40 MG/0.4ML IJ SOSY
40.0000 mg | PREFILLED_SYRINGE | INTRAMUSCULAR | Status: DC
  Administered 2022-08-31: 40 mg via SUBCUTANEOUS
  Filled 2022-08-30: qty 0.4

## 2022-08-30 MED ORDER — ONDANSETRON HCL 4 MG/2ML IJ SOLN
INTRAMUSCULAR | Status: AC
  Filled 2022-08-30: qty 2

## 2022-08-30 MED ORDER — DROPERIDOL 2.5 MG/ML IJ SOLN: 0.6250 mg | Freq: Once | INTRAMUSCULAR | Status: DC | PRN

## 2022-08-30 MED ORDER — FENTANYL CITRATE (PF) 100 MCG/2ML IJ SOLN
INTRAMUSCULAR | Status: AC
  Filled 2022-08-30: qty 2

## 2022-08-30 MED ORDER — DEXAMETHASONE SODIUM PHOSPHATE 10 MG/ML IJ SOLN
INTRAMUSCULAR | Status: DC | PRN
  Administered 2022-08-30: 10 mg via INTRAVENOUS

## 2022-08-30 MED ORDER — SUGAMMADEX SODIUM 500 MG/5ML IV SOLN
INTRAVENOUS | Status: AC
  Filled 2022-08-30: qty 5

## 2022-08-30 MED ORDER — ONDANSETRON HCL 4 MG/2ML IJ SOLN: 4.0000 mg | Freq: Four times a day (QID) | INTRAMUSCULAR | Status: DC | PRN

## 2022-08-30 MED ORDER — FENTANYL CITRATE (PF) 100 MCG/2ML IJ SOLN
INTRAMUSCULAR | Status: AC
  Administered 2022-08-30: 50 ug via INTRAVENOUS
  Filled 2022-08-30: qty 2

## 2022-08-30 MED ORDER — PHENYLEPHRINE HCL (PRESSORS) 10 MG/ML IV SOLN
INTRAVENOUS | Status: DC | PRN
  Administered 2022-08-30: 80 ug via INTRAVENOUS

## 2022-08-30 MED ORDER — ROCURONIUM BROMIDE 100 MG/10ML IV SOLN
INTRAVENOUS | Status: DC | PRN
  Administered 2022-08-30: 20 mg via INTRAVENOUS
  Administered 2022-08-30: 60 mg via INTRAVENOUS

## 2022-08-30 MED ORDER — FENTANYL CITRATE (PF) 100 MCG/2ML IJ SOLN
INTRAMUSCULAR | Status: DC | PRN
Start: 2022-08-30 — End: 2022-08-30
  Administered 2022-08-30: 50 ug via INTRAVENOUS
  Administered 2022-08-30: 100 ug via INTRAVENOUS
  Administered 2022-08-30: 50 ug via INTRAVENOUS

## 2022-08-30 MED ORDER — OXYCODONE HCL 5 MG/5ML PO SOLN: 5.0000 mg | Freq: Once | ORAL | Status: DC | PRN

## 2022-08-30 MED ORDER — ONDANSETRON 4 MG PO TBDP: 4.0000 mg | ORAL_TABLET | Freq: Four times a day (QID) | ORAL | Status: DC | PRN

## 2022-08-30 MED ORDER — BUPIVACAINE-EPINEPHRINE 0.5% -1:200000 IJ SOLN
INTRAMUSCULAR | Status: DC | PRN
  Administered 2022-08-30: 15 mL

## 2022-08-30 MED ORDER — MIDAZOLAM HCL 2 MG/2ML IJ SOLN
INTRAMUSCULAR | Status: AC
  Filled 2022-08-30: qty 2

## 2022-08-30 MED ORDER — OXYCODONE-ACETAMINOPHEN 5-325 MG PO TABS
1.0000 | ORAL_TABLET | ORAL | Status: DC | PRN
  Administered 2022-08-31: 1 via ORAL
  Filled 2022-08-30 (×2): qty 1

## 2022-08-30 MED ORDER — TRAMADOL HCL 50 MG PO TABS: 50.0000 mg | ORAL_TABLET | Freq: Four times a day (QID) | ORAL | Status: DC | PRN

## 2022-08-30 MED ORDER — BUPIVACAINE-EPINEPHRINE (PF) 0.5% -1:200000 IJ SOLN
INTRAMUSCULAR | Status: DC | PRN
  Administered 2022-08-30: 35 mL

## 2022-08-30 MED ORDER — SODIUM CHLORIDE 0.9 % IV SOLN: INTRAVENOUS | Status: DC

## 2022-08-30 MED ORDER — DIAZEPAM 5 MG PO TABS: 5.0000 mg | ORAL_TABLET | Freq: Three times a day (TID) | ORAL | Status: DC | PRN

## 2022-08-30 MED ORDER — FENTANYL CITRATE (PF) 100 MCG/2ML IJ SOLN: 25.0000 ug | INTRAMUSCULAR | Status: DC | PRN

## 2022-08-30 MED ORDER — METOCLOPRAMIDE HCL 10 MG PO TABS: 10.0000 mg | ORAL_TABLET | Freq: Three times a day (TID) | ORAL | Status: DC | PRN

## 2022-08-30 MED ORDER — ONDANSETRON HCL 4 MG/2ML IJ SOLN
INTRAMUSCULAR | Status: DC | PRN
  Administered 2022-08-30: 4 mg via INTRAVENOUS

## 2022-08-30 MED ORDER — OXYCODONE HCL 5 MG PO TABS: 5.0000 mg | ORAL_TABLET | Freq: Once | ORAL | Status: DC | PRN

## 2022-08-30 MED ORDER — CHLORHEXIDINE GLUCONATE CLOTH 2 % EX PADS
6.0000 | MEDICATED_PAD | Freq: Every day | CUTANEOUS | Status: DC
  Administered 2022-08-30: 6 via TOPICAL

## 2022-08-30 MED ORDER — DEXMEDETOMIDINE (PRECEDEX) IN NS 20 MCG/5ML (4 MCG/ML) IV SYRINGE
PREFILLED_SYRINGE | INTRAVENOUS | Status: DC | PRN
  Administered 2022-08-30 (×2): 8 ug via INTRAVENOUS
  Administered 2022-08-30: 4 ug via INTRAVENOUS

## 2022-08-30 MED ORDER — SODIUM CHLORIDE 0.9 % IV SOLN
2.0000 g | INTRAVENOUS | Status: DC
  Administered 2022-08-30: 2 g via INTRAVENOUS
  Filled 2022-08-30: qty 20

## 2022-08-30 MED ORDER — ALBUTEROL SULFATE HFA 108 (90 BASE) MCG/ACT IN AERS
INHALATION_SPRAY | RESPIRATORY_TRACT | Status: DC | PRN
  Administered 2022-08-30 (×2): 3 via RESPIRATORY_TRACT

## 2022-08-30 MED ORDER — ACETAMINOPHEN 10 MG/ML IV SOLN: 1000.0000 mg | Freq: Once | INTRAVENOUS | Status: DC | PRN

## 2022-08-30 MED ORDER — LIDOCAINE HCL (CARDIAC) PF 100 MG/5ML IV SOSY
PREFILLED_SYRINGE | INTRAVENOUS | Status: DC | PRN
  Administered 2022-08-30: 100 mg via INTRAVENOUS

## 2022-08-30 MED ORDER — ACETAMINOPHEN 10 MG/ML IV SOLN
INTRAVENOUS | Status: AC
  Administered 2022-08-30: 1000 mg via INTRAVENOUS
  Filled 2022-08-30: qty 100

## 2022-08-30 MED ORDER — SUGAMMADEX SODIUM 500 MG/5ML IV SOLN
INTRAVENOUS | Status: DC | PRN
  Administered 2022-08-30: 300 mg via INTRAVENOUS

## 2022-08-30 MED ORDER — MUPIROCIN 2 % EX OINT
1.0000 | TOPICAL_OINTMENT | Freq: Two times a day (BID) | CUTANEOUS | Status: DC
  Administered 2022-08-30 – 2022-08-31 (×3): 1 via NASAL
  Filled 2022-08-30: qty 22

## 2022-08-30 MED ORDER — METRONIDAZOLE 500 MG/100ML IV SOLN
500.0000 mg | Freq: Two times a day (BID) | INTRAVENOUS | Status: DC
  Administered 2022-08-30: 500 mg via INTRAVENOUS
  Filled 2022-08-30 (×2): qty 100

## 2022-08-30 MED ORDER — DEXAMETHASONE SODIUM PHOSPHATE 10 MG/ML IJ SOLN
INTRAMUSCULAR | Status: AC
  Filled 2022-08-30: qty 1

## 2022-08-30 MED ORDER — DOCUSATE SODIUM 100 MG PO CAPS: 100.0000 mg | ORAL_CAPSULE | Freq: Two times a day (BID) | ORAL | Status: DC | PRN

## 2022-08-30 MED ORDER — MIDAZOLAM HCL 2 MG/2ML IJ SOLN
INTRAMUSCULAR | Status: DC | PRN
  Administered 2022-08-30: 2 mg via INTRAVENOUS

## 2022-08-30 SURGICAL SUPPLY — 71 items
ANCHOR TIS RET SYS 235ML (MISCELLANEOUS) ×2 IMPLANT
BINDER ABDOMINAL 12 ML 46-62 (SOFTGOODS) IMPLANT
BLADE SURG SZ11 CARB STEEL (BLADE) ×2 IMPLANT
CANNULA REDUC XI 12-8 STAPL (CANNULA) ×2
CANNULA REDUCER 12-8 DVNC XI (CANNULA) ×2 IMPLANT
COVER TIP SHEARS 8 DVNC (MISCELLANEOUS) ×2 IMPLANT
COVER TIP SHEARS 8MM DA VINCI (MISCELLANEOUS) ×2
DERMABOND ADVANCED (GAUZE/BANDAGES/DRESSINGS) ×2
DERMABOND ADVANCED .7 DNX12 (GAUZE/BANDAGES/DRESSINGS) ×2 IMPLANT
DRAPE 3/4 80X56 (DRAPES) ×2 IMPLANT
DRAPE ARM DVNC X/XI (DISPOSABLE) ×6 IMPLANT
DRAPE COLUMN DVNC XI (DISPOSABLE) ×2 IMPLANT
DRAPE DA VINCI XI ARM (DISPOSABLE) ×6
DRAPE DA VINCI XI COLUMN (DISPOSABLE) ×2
ELECT CAUTERY BLADE 6.4 (BLADE) ×2 IMPLANT
ELECT REM PT RETURN 9FT ADLT (ELECTROSURGICAL) ×2
ELECTRODE REM PT RTRN 9FT ADLT (ELECTROSURGICAL) ×2 IMPLANT
GLOVE BIOGEL PI IND STRL 7.0 (GLOVE) ×4 IMPLANT
GLOVE BIOGEL PI INDICATOR 7.0 (GLOVE) ×4
GLOVE SURG SYN 6.5 ES PF (GLOVE) ×4 IMPLANT
GLOVE SURG SYN 6.5 PF PI (GLOVE) ×4 IMPLANT
GOWN STRL REUS W/ TWL LRG LVL3 (GOWN DISPOSABLE) ×6 IMPLANT
GOWN STRL REUS W/TWL LRG LVL3 (GOWN DISPOSABLE) ×6
GRASPER SUT TROCAR 14GX15 (MISCELLANEOUS) IMPLANT
IRRIGATOR SUCT 8 DISP DVNC XI (IRRIGATION / IRRIGATOR) IMPLANT
IRRIGATOR SUCTION 8MM XI DISP (IRRIGATION / IRRIGATOR)
IV NS 1000ML (IV SOLUTION)
IV NS 1000ML BAXH (IV SOLUTION) IMPLANT
KIT TURNOVER KIT A (KITS) ×2 IMPLANT
LABEL OR SOLS (LABEL) ×2 IMPLANT
MANIFOLD NEPTUNE II (INSTRUMENTS) ×2 IMPLANT
MESH PROGRIP HERNIA FLAT 15X15 (Mesh General) IMPLANT
NDL INSUFFLATION 14GA 120MM (NEEDLE) ×2 IMPLANT
NEEDLE HYPO 22GX1.5 SAFETY (NEEDLE) ×2 IMPLANT
NEEDLE INSUFFLATION 14GA 120MM (NEEDLE) ×2 IMPLANT
OBTURATOR OPTICAL STANDARD 8MM (TROCAR) ×2
OBTURATOR OPTICAL STND 8 DVNC (TROCAR) ×2
OBTURATOR OPTICALSTD 8 DVNC (TROCAR) ×2 IMPLANT
PACK LAP CHOLECYSTECTOMY (MISCELLANEOUS) ×2 IMPLANT
PENCIL SMOKE EVACUATOR (MISCELLANEOUS) ×2 IMPLANT
RELOAD STAPLE 45 2.5 WHT DVNC (STAPLE) IMPLANT
RELOAD STAPLE 45 3.5 BLU DVNC (STAPLE) ×2 IMPLANT
RELOAD STAPLER 2.5X45 WHT DVNC (STAPLE) IMPLANT
RELOAD STAPLER 3.5X45 BLU DVNC (STAPLE) ×2 IMPLANT
SEAL CANN UNIV 5-8 DVNC XI (MISCELLANEOUS) ×6 IMPLANT
SEAL XI 5MM-8MM UNIVERSAL (MISCELLANEOUS) ×6
SEALER VESSEL DA VINCI XI (MISCELLANEOUS) ×2
SEALER VESSEL EXT DVNC XI (MISCELLANEOUS) IMPLANT
SET TUBE SMOKE EVAC HIGH FLOW (TUBING) ×2 IMPLANT
SOLUTION ELECTROLUBE (MISCELLANEOUS) ×2 IMPLANT
STAPLER 45 DA VINCI SURE FORM (STAPLE) ×2
STAPLER 45 SUREFORM DVNC (STAPLE) ×2 IMPLANT
STAPLER CANNULA SEAL DVNC XI (STAPLE) ×2 IMPLANT
STAPLER CANNULA SEAL XI (STAPLE) ×2
STAPLER RELOAD 2.5X45 WHITE (STAPLE)
STAPLER RELOAD 2.5X45 WHT DVNC (STAPLE)
STAPLER RELOAD 3.5X45 BLU DVNC (STAPLE) ×2
STAPLER RELOAD 3.5X45 BLUE (STAPLE) ×2
SUT MNCRL AB 4-0 PS2 18 (SUTURE) ×2 IMPLANT
SUT STRATAFIX 0 PDS+ CT-2 23 (SUTURE) ×4
SUT V-LOC 90 ABS 3-0 VLT  V-20 (SUTURE) ×2
SUT V-LOC 90 ABS 3-0 VLT V-20 (SUTURE) IMPLANT
SUT V-LOC 90 ABS DVC 3-0 CL (SUTURE) ×4 IMPLANT
SUT VIC AB 3-0 SH 27 (SUTURE) ×2
SUT VIC AB 3-0 SH 27X BRD (SUTURE) ×2 IMPLANT
SUT VICRYL 0 AB UR-6 (SUTURE) ×2 IMPLANT
SUTURE STRATFX 0 PDS+ CT-2 23 (SUTURE) ×2 IMPLANT
SYR 30ML LL (SYRINGE) ×2 IMPLANT
TRAY FOLEY MTR SLVR 16FR STAT (SET/KITS/TRAYS/PACK) ×2 IMPLANT
TROCAR XCEL NON-BLD 5MMX100MML (ENDOMECHANICALS) IMPLANT
WATER STERILE IRR 500ML POUR (IV SOLUTION) ×2 IMPLANT

## 2022-08-30 NOTE — Plan of Care (Signed)

## 2022-08-30 NOTE — H&P (Signed)
Subjective:   CC: epigastric pain  HPI:  Troy Mcgee is a 36 y.o. male who was referred by [facility] for evaluation of above cc.   Symptoms were first noted several years ago. Pain is sharp, worsened recently.  Associated with lump, exacerbated by nothing  Lump is not reducible.     Past Medical History:  has a past medical history of Metacarpal bone fracture (03/03/2014).  Past Surgical History:  Past Surgical History:  Procedure Laterality Date   HYPOSPADIAS CORRECTION  age 89-3   OPEN REDUCTION INTERNAL FIXATION (ORIF) METACARPAL Right 03/10/2014   Procedure: OPEN REDUCTION INTERNAL FIXATION (ORIF) RIGHT HAND RING METACARPAL FRACTURE;  Surgeon: Marlowe Shores, MD;  Location: [facility];  Service: Orthopedics;  Laterality: Right;    Family History: family history is not on file.  Social History:  reports that he has been smoking cigarettes. He has never used smokeless tobacco. He reports current alcohol use. He reports that he does not use drugs.  Current Medications:  Prior to Admission medications   Medication Sig Start Date End Date Taking? Authorizing Provider  diazepam (VALIUM) 5 MG tablet Take 1 tablet (5 mg total) by mouth every 8 (eight) hours as needed for anxiety. 04/19/22   Irean Hong, MD  metoCLOPramide (REGLAN) 10 MG tablet Take 1 tablet (10 mg total) by mouth every 8 (eight) hours as needed for nausea (headache). 04/19/22   Irean Hong, MD  oxyCODONE-acetaminophen (ROXICET) 5-325 MG per tablet Take 1 tablet by mouth every 4 (four) hours as needed for severe pain. 03/10/14   Dairl Ponder, MD    Allergies:  Allergies as of 08/29/2022 - Review Complete 08/29/2022  Allergen Reaction Noted   Eggs or egg-derived products Other (See Comments) 02/12/2013    ROS:  General: Denies weight loss, weight gain, fatigue, fevers, chills, and night sweats. Eyes: Denies blurry vision, double vision, eye pain, itchy eyes, and tearing. Ears: Denies hearing loss,  earache, and ringing in ears. Nose: Denies sinus pain, congestion, infections, runny nose, and nosebleeds. Mouth/throat: Denies hoarseness, sore throat, bleeding gums, and difficulty swallowing. Heart: Denies chest pain, palpitations, racing heart, irregular heartbeat, leg pain or swelling, and decreased activity tolerance. Respiratory: Denies breathing difficulty, shortness of breath, wheezing, cough, and sputum. GI: Denies change in appetite, heartburn, nausea, vomiting, constipation, diarrhea, and blood in stool. GU: Denies difficulty urinating, pain with urinating, urgency, frequency, blood in urine. Musculoskeletal: Denies joint stiffness, pain, swelling, muscle weakness. Skin: Denies rash, itching, mass, tumors, sores, and boils Neurologic: Denies headache, fainting, dizziness, seizures, numbness, and tingling. Psychiatric: Denies depression, anxiety, difficulty sleeping, and memory loss. Endocrine: Denies heat or cold intolerance, and increased thirst or urination. Blood/lymph: Denies easy bruising, easy bruising, and swollen glands     Objective:     BP (!) 158/83 (BP Location: Right Arm)   Pulse 88   Temp 98.7 F (37.1 C) (Oral)   Resp 18   Ht 5\' 9"  (1.753 m)   Wt 128.8 kg   SpO2 99%   BMI 41.94 kg/m   Constitutional :  alert, cooperative, appears stated age, and no distress  Lymphatics/Throat:  no asymmetry, masses, or scars  Respiratory:  clear to auscultation bilaterally  Cardiovascular:  regular rate and rhythm  Gastrointestinal: Soft, no guarding . ventral hernia noted, small, incarcerated, minimal TTP.  Also has minor TTP in RLQ.  Musculoskeletal: lying in bed, no obvious difficulty moving upper extremities  Skin: Cool and moist  Psychiatric: Normal affect,  non-agitated, not confused       LABS:     Latest Ref Rng & Units 08/29/2022    4:40 PM 04/19/2022    4:05 AM 05/10/2012    1:49 AM  CMP  Glucose 70 - 99 mg/dL 161  096  045   BUN 6 - 20 mg/dL 14  21  18     Creatinine 0.61 - 1.24 mg/dL  4.09  8.11   Sodium 135 - 145 mmol/L 136  139  135   Potassium 3.5 - 5.1 mmol/L 3.7  3.9  4.0   Chloride 98 - 111 mmol/L 100  105  98   CO2 22 - 32 mmol/L 27  28  25    Calcium 8.9 - 10.3 mg/dL 9.1  8.9  8.9   Total Protein 6.5 - 8.1 g/dL 8.3  8.0    Total Bilirubin 0.3 - 1.2 mg/dL 0.7  0.5    Alkaline Phos 38 - 126 U/L 70  65    AST 15 - 41 U/L 24  26    ALT 0 - 44 U/L 35  36        Latest Ref Rng & Units 08/29/2022    4:40 PM 04/19/2022    4:05 AM 03/10/2014   10:35 AM  CBC  WBC 4.0 - 10.5 K/uL 12.4  10.8    Hemoglobin 13.0 - 17.0 g/dL 04/21/2022  03/12/2014  78.2   Hematocrit 39.0 - 52.0 % 43.6  43.3    Platelets 150 - 400 K/uL 307  336      RADS: CLINICAL DATA:  Abdominal pain.  Epigastric pain for 3 days.   EXAM: CT ABDOMEN AND PELVIS WITH CONTRAST   TECHNIQUE: Multidetector CT imaging of the abdomen and pelvis was performed using the standard protocol following bolus administration of intravenous contrast.   RADIATION DOSE REDUCTION: This exam was performed according to the departmental dose-optimization program which includes automated exposure control, adjustment of the mA and/or kV according to patient size and/or use of iterative reconstruction technique.   CONTRAST:  95.6 OMNIPAQUE IOHEXOL 300 MG/ML  SOLN   COMPARISON:  None Available.   FINDINGS: Lower chest: No acute abnormality.   Hepatobiliary: No focal liver abnormality is seen. No gallstones, gallbladder wall thickening, or biliary dilatation.   Pancreas: Unremarkable. No pancreatic ductal dilatation or surrounding inflammatory changes.   Spleen: Normal in size without focal abnormality.   Adrenals/Urinary Tract: Adrenal glands are unremarkable. Kidneys are normal, without renal calculi, focal lesion, or hydronephrosis. Bladder is unremarkable.   Stomach/Bowel: Stomach is within normal limits. Appendix is fluid-filled and mildly dilated measuring up to 8 mm with  adjacent fat stranding concerning for acute appendicitis. No periappendiceal fluid collection or abscess. No sigmoid colonic diverticulosis without evidence of acute diverticulitis.   Vascular/Lymphatic: No significant vascular findings are present. No enlarged abdominal or pelvic lymph nodes. Subcentimeter lymph nodes in the right lower quadrant, likely reactive secondary to appendicitis.   Reproductive: Prostate is unremarkable.   Other: No abdominal wall hernia or abnormality. Fat containing umbilical hernia. No abdominopelvic ascites.   Musculoskeletal: No acute or significant osseous findings.   IMPRESSION: 1. Mildly dilated appendix with mucosal enhancement measuring up to 8 mm with adjacent fat stranding concerning for acute appendicitis. Surgical consultation for further management is recommended. No periappendiceal fluid collection or abscess. 2. Small fat containing umbilical hernia. 3. Colonic diverticulosis prominent in the sigmoid colon without evidence of acute diverticulitis.   Findings of acute appendicitis and  recommendation for surgical consultation were reported to Dr. Darnelle Catalan at approximately 10:58 p.m. on 08/29/2022.     Electronically Signed   By: Larose Hires D.O.   On: 08/29/2022 23:00   Assessment:     Epigastric pain with CT findings above.  Epigastric pain is right over area of incarcerated hernia.    Pt did not complain of any RLQ and has not noticed any until palpation of area.  Will proceed with diagnostic laparoscopy and appendectomy at same time as hernia repair, due to the finidings reported above, understanding appendectomy will be performed even if it looks clinically benign.  Plan:    Discussed the risk of surgery including recurrence, which can be up to 50% in the case of incisional or complex hernias, possible use of prosthetic materials (mesh) and the increased risk of mesh infxn if used, bleeding, chronic pain, post-op infxn,  post-op SBO or ileus, and possible re-operation to address said risks. The risks of general anesthetic, if used, includes MI, CVA, sudden death or even reaction to anesthetic medications also discussed. Alternatives include continued observation.  Benefits include possible symptom relief, prevention , strangulation, enlargement in size over time, and the risk of emergency surgery in the face of strangulation, decline in health.   Typical post-op recovery time of 3-5 days with couple  weeks of activity restrictions were also discussed.  The patient verbalized understanding and all questions were answered to the patient's satisfaction.  Admit, IVF, NPO, IV abx while waiting for case.  labs/images/medications/previous chart entries reviewed personally and relevant changes/updates noted above.

## 2022-08-30 NOTE — Anesthesia Procedure Notes (Signed)
Procedure Name: Intubation Date/Time: 08/30/2022 1:43 PM  Performed by: Elmarie Mainland, CRNAPre-anesthesia Checklist: Patient identified, Emergency Drugs available, Suction available and Patient being monitored Patient Re-evaluated:Patient Re-evaluated prior to induction Oxygen Delivery Method: Circle system utilized Preoxygenation: Pre-oxygenation with 100% oxygen Induction Type: IV induction Ventilation: Two handed mask ventilation required and Oral airway inserted - appropriate to patient size Laryngoscope Size: McGraph and 4 Grade View: Grade I Tube type: Oral Tube size: 7.0 mm Number of attempts: 1 Airway Equipment and Method: Stylet, Oral airway and Video-laryngoscopy Placement Confirmation: ETT inserted through vocal cords under direct vision, positive ETCO2 and breath sounds checked- equal and bilateral Secured at: 22 cm Tube secured with: Tape Dental Injury: Teeth and Oropharynx as per pre-operative assessment

## 2022-08-30 NOTE — Transfer of Care (Signed)
Immediate Anesthesia Transfer of Care Note  Patient: Troy Mcgee  Procedure(s) Performed: XI ROBOTIC LAPAROSCOPIC ASSISTED APPENDECTOMY XI ROBOTIC ASSISTED VENTRAL HERNIA INSERTION OF MESH  Patient Location: PACU  Anesthesia Type:General  Level of Consciousness: awake, drowsy and patient cooperative  Airway & Oxygen Therapy: Patient Spontanous Breathing and Patient connected to face mask oxygen  Post-op Assessment: Report given to RN and Post -op Vital signs reviewed and stable  Post vital signs: Reviewed and stable  Last Vitals:  Vitals Value Taken Time  BP 154/89 08/30/22 1533  Temp    Pulse 96 08/30/22 1537  Resp 13 08/30/22 1537  SpO2 98 % 08/30/22 1537  Vitals shown include unvalidated device data.  Last Pain:  Vitals:   08/30/22 0800  TempSrc:   PainSc: 0-No pain         Complications: No notable events documented.

## 2022-08-30 NOTE — Op Note (Signed)
Preoperative diagnosis: acute appendicitis and incarcerated ventral hernia  Postoperative diagnosis: Same  Procedure: Robotic assisted laparoscopic appendectomy,Incarcerated ventral hernia repair with ProGrip mesh  Anesthesia: GETA  Surgeon: Sung Amabile  Wound Classification: clean contaminated  Specimen: Appendix  Complications: None  Estimated Blood Loss: 3 mL   Indications: Patient is a 36 y.o. male  presented with above.  Please see H&P for further details.    FIndings: 1.  Irritated appendix  2. No peri-appendiceal abscess or phlegmon 3. Normal anatomy 4. Appendiceal artery ligated and divided with stapler 5.  1 cm x 1 cm incarcerated ventral hernia containing preperitoneal fat 6.  Hernia repair with ProGrip 4 cm x 4 cm and primary repair with 0 strata fix 7. Adequate hemostasis.   Description of procedure: The patient was placed on the operating table in the supine position, left arm tucked. General anesthesia was induced. A time-out was completed verifying correct patient, procedure, site, positioning, and implant(s) and/or special equipment prior to beginning this procedure. The abdomen was prepped and draped in the usual sterile fashion.   Palmer's point located and Veress needle was inserted.  After confirming 2 clicks and a positive saline drop test, gas insufflation was initiated until the abdominal pressure was measured at 15 mmHg.  Afterwards, the Veress needle was removed and a 8 mm port was placed through a periumbilical site using Optiview technique after incision with an 11 blade.  After local was infused, 2 additional incision was made 8 cm apart each side along the left side of the abdominal wall from the initial incision.  An 8 mm port was caudaed and 22mm port cephalad from initial incision, both under direct visualization.  No injuries from trocar placements were noted. The table was placed in the Trendelenburg position with the right side elevated.  Xi robotic  platform was then brought to the operative field and docked.  An inflamed appendix was identified.  Dissection attempted to isolate it above the surrounding bowels but due to the extensive adhesions to the surrounding tissue, this was not feasible with scissors alone.  Vessel sealer used to complete the dissection and isolate the appendix and elevated.  Infection was present within the abdominal cavity due to appendicitis.  Vessel sealer was then used to transect the mesoappendix down to the base of the appendix. A blue load linear cutting stapler was then used to divide and staple the base of the appendix. No bleeding from the staple lines noted.  The appendiceal stump and mesoappendix staple line examined again and hemostasis noted. No other pathology was identified within pelvis.   1 cm x 1 cm ventral hernia defect noted above the falciform ligament..  Preperitoneal plane was entered by making a incision along the right lateral aspect of the peritoneum.  This flap was carried across the abdomen to the other side of the defect, reducing all hernia contents and preperitoneal lipoma within the hernia defect.  Small amounts of bleeding was controlled with cautery.  Once adequate exposure of the defect and adequate space was created to place the mesh, insufflation dropped to 66mm and transfacial suture with 0 stratafix used to primarily close defect under minimal tension.    ProGrip mesh cut to size with adequate overlap around the defect edges was placed within the abdominal cavity through 35mm port and secured to the abdominal wall centered over the defect. The peritoneal flap was then closed with a running 3-0 V lock.  Endo Catch bag placed through the 12 mm  port in the appendix and transected hernia contents was placed within the bag and removed.  Robot was undocked.  The 51mm cannula was removed and port site was closed using PMI device and 0 vicryl suture, ensuring no bowels were injured during this  process.  Abdomen then desufflated while camera within abdomen to ensure no signs of new bleed prior to removing camera and rest of ports completely.  All skin incisions closed with interrupted 3-0 Vicryl and runninrg 4-0 Monocryl in a subcuticular fashion.  All wounds then dressed with Dermabond.The patient tolerated the procedure well, awakened from anesthesia and was taken to the postanesthesia care unit in satisfactory condition.  Sponge count and instrument count correct at the end of the procedure.  Foley removed.

## 2022-08-30 NOTE — ED Notes (Signed)
Patient transported to floor at this time.  All personal belongings transported with patient ?

## 2022-08-30 NOTE — Anesthesia Postprocedure Evaluation (Signed)
Anesthesia Post Note  Patient: Troy Mcgee  Procedure(s) Performed: XI ROBOTIC LAPAROSCOPIC ASSISTED APPENDECTOMY XI ROBOTIC ASSISTED VENTRAL HERNIA INSERTION OF MESH  Patient location during evaluation: PACU Anesthesia Type: General Level of consciousness: awake and alert Pain management: pain level controlled Vital Signs Assessment: post-procedure vital signs reviewed and stable Respiratory status: spontaneous breathing, nonlabored ventilation and respiratory function stable Cardiovascular status: blood pressure returned to baseline and stable Postop Assessment: no apparent nausea or vomiting Anesthetic complications: no   No notable events documented.   Last Vitals:  Vitals:   08/30/22 1654 08/30/22 1934  BP: 118/65 132/66  Pulse: 85 80  Resp: 16 16  Temp: 36.5 C 36.8 C  SpO2: 97% 96%    Last Pain:  Vitals:   08/30/22 1934  TempSrc: Oral  PainSc:                  Foye Deer

## 2022-08-30 NOTE — Anesthesia Preprocedure Evaluation (Addendum)
Anesthesia Evaluation  Patient identified by MRN, date of birth, ID band Patient awake    Reviewed: Allergy & Precautions, NPO status , Patient's Chart, lab work & pertinent test results  Airway Mallampati: III  TM Distance: >3 FB Neck ROM: full    Dental  (+) Chipped   Pulmonary Current Smoker and Patient abstained from smoking.,    Pulmonary exam normal        Cardiovascular negative cardio ROS Normal cardiovascular exam     Neuro/Psych negative neurological ROS  negative psych ROS   GI/Hepatic Neg liver ROS, appendicitis, ventral hernia   Endo/Other  Morbid obesity  Renal/GU      Musculoskeletal   Abdominal Normal abdominal exam  (+)   Peds  Hematology negative hematology ROS (+)   Anesthesia Other Findings Past Medical History: 03/03/2014: Metacarpal bone fracture     Comment:  right ring  Past Surgical History: age 55-3: HYPOSPADIAS CORRECTION 03/10/2014: OPEN REDUCTION INTERNAL FIXATION (ORIF) METACARPAL; Right     Comment:  Procedure: OPEN REDUCTION INTERNAL FIXATION (ORIF) RIGHT              HAND RING METACARPAL FRACTURE;  Surgeon: Marlowe Shores, MD;  Location: [facility];                Service: Orthopedics;  Laterality: Right;  BMI    Body Mass Index: 41.67 kg/m      Reproductive/Obstetrics negative OB ROS                            Anesthesia Physical Anesthesia Plan  ASA: 3  Anesthesia Plan: General ETT   Post-op Pain Management: Ofirmev IV (intra-op)* and Toradol IV (intra-op)*   Induction: Intravenous  PONV Risk Score and Plan: 3 and Ondansetron, Dexamethasone and Midazolam  Airway Management Planned: Oral ETT  Additional Equipment:   Intra-op Plan:   Post-operative Plan: Extubation in OR  Informed Consent: I have reviewed the patients History and Physical, chart, labs and discussed the procedure including the risks,  benefits and alternatives for the proposed anesthesia with the patient or authorized representative who has indicated his/her understanding and acceptance.     Dental Advisory Given  Plan Discussed with: Anesthesiologist, CRNA and Surgeon  Anesthesia Plan Comments:        Anesthesia Quick Evaluation

## 2022-08-31 LAB — CBC
HCT: 42.9 % (ref 39.0–52.0)
Hemoglobin: 13.9 g/dL (ref 13.0–17.0)
MCH: 30.8 pg (ref 26.0–34.0)
MCHC: 32.4 g/dL (ref 30.0–36.0)
MCV: 94.9 fL (ref 80.0–100.0)
Platelets: 333 10*3/uL (ref 150–400)
RBC: 4.52 MIL/uL (ref 4.22–5.81)
RDW: 13.2 % (ref 11.5–15.5)
WBC: 14.1 10*3/uL — ABNORMAL HIGH (ref 4.0–10.5)
nRBC: 0 % (ref 0.0–0.2)

## 2022-08-31 LAB — BASIC METABOLIC PANEL
Anion gap: 7 (ref 5–15)
BUN: 13 mg/dL (ref 6–20)
CO2: 28 mmol/L (ref 22–32)
Calcium: 9 mg/dL (ref 8.9–10.3)
Chloride: 104 mmol/L (ref 98–111)
Creatinine, Ser: 0.94 mg/dL (ref 0.61–1.24)
GFR, Estimated: >60 mL/min (ref >60)
Glucose, Bld: 143 mg/dL — ABNORMAL HIGH (ref 70–99)
Potassium: 4.7 mmol/L (ref 3.5–5.1)
Sodium: 139 mmol/L (ref 135–145)

## 2022-08-31 MED ORDER — ACETAMINOPHEN 325 MG PO TABS: 650.0000 mg | ORAL_TABLET | Freq: Three times a day (TID) | ORAL | 0 refills | Status: AC | PRN

## 2022-08-31 MED ORDER — OXYCODONE-ACETAMINOPHEN 5-325 MG PO TABS: 1.0000 | ORAL_TABLET | ORAL | 0 refills | Status: AC | PRN

## 2022-08-31 MED ORDER — IBUPROFEN 800 MG PO TABS: 800.0000 mg | ORAL_TABLET | Freq: Three times a day (TID) | ORAL | 0 refills | Status: AC | PRN

## 2022-08-31 MED ORDER — DOCUSATE SODIUM 100 MG PO CAPS: 100.0000 mg | ORAL_CAPSULE | Freq: Two times a day (BID) | ORAL | 0 refills | Status: AC | PRN

## 2022-08-31 NOTE — Plan of Care (Signed)
IV's removed, discharge instructions reviewed, Rx for pain medication given to patient, note given for work and patient to be discharged with family to home

## 2022-08-31 NOTE — Discharge Instructions (Signed)
Appendectomy and Hernia repair, Care After This sheet gives you information about how to care for yourself after your procedure. Your health care provider may also give you more specific instructions. If you have problems or questions, contact your health care provider. What can I expect after the procedure? After your procedure, it is common to have the following: Pain in your abdomen, especially in the incision areas. You will be given medicine to control the pain. Tiredness. This is a normal part of the recovery process. Your energy level will return to normal over the next several weeks. Changes in your bowel movements, such as constipation or needing to go more often. Talk with your health care provider about how to manage this. Follow these instructions at home: Medicines  tylenol and advil as needed for discomfort.  Please alternate between the two every four hours as needed for pain.    Use narcotics, if prescribed, only when tylenol and motrin is not enough to control pain.  325-650mg  every 8hrs to max of 3000mg /24hrs (including the 325mg  in every norco dose) for the tylenol.    Advil up to 800mg  per dose every 8hrs as needed for pain.   PLEASE RECORD NUMBER OF PILLS TAKEN UNTIL NEXT FOLLOW UP APPT.  THIS WILL HELP DETERMINE HOW READY YOU ARE TO BE RELEASED FROM ANY ACTIVITY RESTRICTIONS Do not drive or use heavy machinery while taking prescription pain medicine. Do not drink alcohol while taking prescription pain medicine.  Incision care    Follow instructions from your health care provider about how to take care of your incision areas. Make sure you: Keep your incisions clean and dry. Wash your hands with soap and water before and after applying medicine to the areas, and before and after changing your bandage (dressing). If soap and water are not available, use hand sanitizer. Change your dressing as told by your health care provider. Leave stitches (sutures), skin glue, or  adhesive strips in place. These skin closures may need to stay in place for 2 weeks or longer. If adhesive strip edges start to loosen and curl up, you may trim the loose edges. Do not remove adhesive strips completely unless your health care provider tells you to do that. Do not wear tight clothing over the incisions. Tight clothing may rub and irritate the incision areas, which may cause the incisions to open. Do not take baths, swim, or use a hot tub until your health care provider approves. OK TO SHOWER IN 24HRS.   Check your incision area every day for signs of infection. Check for: More redness, swelling, or pain. More fluid or blood. Warmth. Pus or a bad smell. Activity Avoid lifting anything that is heavier than 10 lb (4.5 kg) for 2 weeks or until your health care provider says it is okay. No pushing/pulling greater than 30lbs You may resume normal activities as told by your health care provider. Ask your health care provider what activities are safe for you. Take rest breaks during the day as needed. Eating and drinking Follow instructions from your health care provider about what you can eat after surgery. To prevent or treat constipation while you are taking prescription pain medicine, your health care provider may recommend that you: Drink enough fluid to keep your urine clear or pale yellow. Take over-the-counter or prescription medicines. Eat foods that are high in fiber, such as fresh fruits and vegetables, whole grains, and beans. Limit foods that are high in fat and processed sugars, such as  fried and sweet foods. General instructions Ask your health care provider when you will need an appointment to get your sutures or staples removed. Keep all follow-up visits as told by your health care provider. This is important. Contact a health care provider if: You have more redness, swelling, or pain around your incisions. You have more fluid or blood coming from the  incisions. Your incisions feel warm to the touch. You have pus or a bad smell coming from your incisions or your dressing. You have a fever. You have an incision that breaks open (edges not staying together) after sutures or staples have been removed. You develop a rash. You have chest pain or difficulty breathing. You have pain or swelling in your legs. You feel light-headed or you faint. Your abdomen swells (becomes distended). You have nausea or vomiting. You have blood in your stool (feces). This information is not intended to replace advice given to you by your health care provider. Make sure you discuss any questions you have with your health care provider. Document Released: 07/04/2005 Document Revised: 09/03/2018 Document Reviewed: 09/15/2016 Elsevier Interactive Patient Education  2019 [employer].

## 2022-08-31 NOTE — Discharge Summary (Signed)
Physician Discharge Summary  Patient ID: Troy Mcgee MRN: [id number] DOB/AGE: 02-12-1986 36 y.o.  Admit date: 08/29/2022 Discharge date: August 31, 2022  Admission Diagnoses: Acute appendicitis and incarcerated ventral hernia  Discharge Diagnoses:  Same as above  Discharged Condition: good  Hospital Course: admitted for above. Underwent surgery.  Please see op note for details.  Post op, recovered as expected.  At time of d/c, tolerating diet and pain controlled  Consults: None  Discharge Exam: Blood pressure 129/63, pulse 80, temperature (!) 97.3 F (36.3 C), temperature source Oral, resp. rate 16, height 5\' 9"  (1.753 m), weight 128 kg, SpO2 97 %. General appearance: alert, cooperative, and no distress GI: soft, non-tender; bowel sounds normal; no masses,  no organomegaly incisions clean dry and intact  Disposition:  Discharge disposition: 01-Home or Self Care       Discharge Instructions     Discharge patient   Complete by: As directed    Discharge disposition: 01-Home or Self Care   Discharge patient date: 08/31/2022      Allergies as of 08/31/2022       Reactions   Eggs Or Egg-derived Products Other (See Comments)   ITCHING OF THROAT        Medication List     TAKE these medications    acetaminophen 325 MG tablet Commonly known as: Tylenol Take 2 tablets (650 mg total) by mouth every 8 (eight) hours as needed for mild pain.   diazepam 5 MG tablet Commonly known as: Valium Take 1 tablet (5 mg total) by mouth every 8 (eight) hours as needed for anxiety.   docusate sodium 100 MG capsule Commonly known as: Colace Take 1 capsule (100 mg total) by mouth 2 (two) times daily as needed for up to 10 days for mild constipation.   ibuprofen 800 MG tablet Commonly known as: ADVIL Take 1 tablet (800 mg total) by mouth every 8 (eight) hours as needed for mild pain or moderate pain.   metoCLOPramide 10 MG tablet Commonly known as: REGLAN Take 1 tablet (10 mg  total) by mouth every 8 (eight) hours as needed for nausea (headache).   oxyCODONE-acetaminophen 5-325 MG tablet Commonly known as: Roxicet Take 1 tablet by mouth every 4 (four) hours as needed for up to 6 doses for severe pain.        Follow-up Information     10/31/2022, Lavayah Vita, DO Follow up in 2 week(s).   Specialty: Surgery Why: post op ventral hernia and appendectomy Contact information: [address] [place] [phone]                  Total time spent arranging discharge was >69min. Signed: 31m 08/31/2022, 10:23 AM

## 2022-09-02 ENCOUNTER — Encounter: Payer: Self-pay | Admitting: Surgery

## 2022-09-03 LAB — SURGICAL PATHOLOGY
